# Patient Record
Sex: Female | Born: 1960
Health system: Southern US, Community
[De-identification: ages and names within clinical notes are randomized; demographics above are authoritative.]

## PROBLEM LIST (undated history)

## (undated) DIAGNOSIS — T7840XA Allergy, unspecified, initial encounter: Secondary | ICD-10-CM

## (undated) DIAGNOSIS — Z9109 Other allergy status, other than to drugs and biological substances: Secondary | ICD-10-CM

## (undated) HISTORY — DX: Allergy, unspecified, initial encounter: T78.40XA

## (undated) HISTORY — DX: Other allergy status, other than to drugs and biological substances: Z91.09

---

## 2000-07-19 ENCOUNTER — Emergency Department (HOSPITAL_COMMUNITY): Admission: EM | Admit: 2000-07-19 | Discharge: 2000-07-19 | Payer: Self-pay | Admitting: *Deleted

## 2000-10-08 ENCOUNTER — Other Ambulatory Visit: Admission: RE | Admit: 2000-10-08 | Discharge: 2000-10-08 | Payer: Self-pay | Admitting: Internal Medicine

## 2001-10-15 ENCOUNTER — Other Ambulatory Visit: Admission: RE | Admit: 2001-10-15 | Discharge: 2001-10-15 | Payer: Self-pay | Admitting: Internal Medicine

## 2003-10-27 ENCOUNTER — Ambulatory Visit (HOSPITAL_COMMUNITY): Admission: RE | Admit: 2003-10-27 | Discharge: 2003-10-27 | Payer: Self-pay | Admitting: General Practice

## 2004-11-06 ENCOUNTER — Ambulatory Visit: Payer: Self-pay | Admitting: Internal Medicine

## 2004-11-06 ENCOUNTER — Other Ambulatory Visit: Admission: RE | Admit: 2004-11-06 | Discharge: 2004-11-06 | Payer: Self-pay | Admitting: Internal Medicine

## 2004-11-06 ENCOUNTER — Encounter (INDEPENDENT_AMBULATORY_CARE_PROVIDER_SITE_OTHER): Payer: Self-pay | Admitting: *Deleted

## 2004-11-21 ENCOUNTER — Ambulatory Visit (HOSPITAL_COMMUNITY): Admission: RE | Admit: 2004-11-21 | Discharge: 2004-11-21 | Payer: Self-pay | Admitting: Internal Medicine

## 2005-03-25 ENCOUNTER — Ambulatory Visit: Payer: Self-pay | Admitting: Internal Medicine

## 2005-11-24 ENCOUNTER — Ambulatory Visit (HOSPITAL_COMMUNITY): Admission: RE | Admit: 2005-11-24 | Discharge: 2005-11-24 | Payer: Self-pay | Admitting: Internal Medicine

## 2005-12-17 ENCOUNTER — Ambulatory Visit: Payer: Self-pay | Admitting: Internal Medicine

## 2005-12-17 LAB — CONVERTED CEMR LAB
ALT: 12 units/L (ref 0–40)
AST: 16 units/L (ref 0–37)
Albumin: 3.6 g/dL (ref 3.5–5.2)
Alkaline Phosphatase: 74 units/L (ref 39–117)
BUN: 8 mg/dL (ref 6–23)
Basophils Absolute: 0.1 10*3/uL (ref 0.0–0.1)
Basophils Relative: 1.4 % — ABNORMAL HIGH (ref 0.0–1.0)
Bilirubin Urine: NEGATIVE
CO2: 30 meq/L (ref 19–32)
Calcium: 9.3 mg/dL (ref 8.4–10.5)
Chloride: 103 meq/L (ref 96–112)
Chol/HDL Ratio, serum: 3.1
Cholesterol: 155 mg/dL (ref 0–200)
Creatinine, Ser: 0.8 mg/dL (ref 0.4–1.2)
Eosinophil percent: 0.9 % (ref 0.0–5.0)
GFR calc non Af Amer: 83 mL/min
Glomerular Filtration Rate, Af Am: 101 mL/min/{1.73_m2}
Glucose, Bld: 89 mg/dL (ref 70–99)
HCT: 36.9 % (ref 36.0–46.0)
HDL: 50.4 mg/dL (ref >39.0)
Hemoglobin, Urine: NEGATIVE
Hemoglobin: 12.4 g/dL (ref 12.0–15.0)
Ketones, ur: NEGATIVE mg/dL
LDL Cholesterol: 92 mg/dL (ref 0–99)
Leukocytes, UA: NEGATIVE
Lymphocytes Relative: 26.4 % (ref 12.0–46.0)
MCHC: 33.6 g/dL (ref 30.0–36.0)
MCV: 90.7 fL (ref 78.0–100.0)
Monocytes Absolute: 0.6 10*3/uL (ref 0.2–0.7)
Monocytes Relative: 7.4 % (ref 3.0–11.0)
Neutro Abs: 5.5 10*3/uL (ref 1.4–7.7)
Neutrophils Relative %: 63.9 % (ref 43.0–77.0)
Nitrite: NEGATIVE
Platelets: 269 10*3/uL (ref 150–400)
Potassium: 3.9 meq/L (ref 3.5–5.1)
RBC: 4.07 M/uL (ref 3.87–5.11)
RDW: 12.6 % (ref 11.5–14.6)
Sodium: 138 meq/L (ref 135–145)
Specific Gravity, Urine: 1.015 (ref 1.000–1.03)
TSH: 0.9 microintl units/mL (ref 0.35–5.50)
Total Bilirubin: 1 mg/dL (ref 0.3–1.2)
Total Protein, Urine: NEGATIVE mg/dL
Total Protein: 7.1 g/dL (ref 6.0–8.3)
Triglyceride fasting, serum: 61 mg/dL (ref 0–149)
Urine Glucose: NEGATIVE mg/dL
Urobilinogen, UA: 2 (ref 0.0–1.0)
VLDL: 12 mg/dL (ref 0–40)
WBC: 8.6 10*3/uL (ref 4.5–10.5)
pH: 7.5 (ref 5.0–8.0)

## 2005-12-19 ENCOUNTER — Other Ambulatory Visit: Admission: RE | Admit: 2005-12-19 | Discharge: 2005-12-19 | Payer: Self-pay | Admitting: Internal Medicine

## 2005-12-19 ENCOUNTER — Encounter: Payer: Self-pay | Admitting: Internal Medicine

## 2005-12-19 ENCOUNTER — Ambulatory Visit: Payer: Self-pay | Admitting: Internal Medicine

## 2006-12-01 ENCOUNTER — Ambulatory Visit (HOSPITAL_COMMUNITY): Admission: RE | Admit: 2006-12-01 | Discharge: 2006-12-01 | Payer: Self-pay | Admitting: Internal Medicine

## 2007-12-02 ENCOUNTER — Ambulatory Visit (HOSPITAL_COMMUNITY): Admission: RE | Admit: 2007-12-02 | Discharge: 2007-12-02 | Payer: Self-pay | Admitting: Internal Medicine

## 2008-12-20 ENCOUNTER — Ambulatory Visit (HOSPITAL_COMMUNITY): Admission: RE | Admit: 2008-12-20 | Discharge: 2008-12-20 | Payer: Self-pay | Admitting: Internal Medicine

## 2009-12-26 ENCOUNTER — Ambulatory Visit (HOSPITAL_COMMUNITY)
Admission: RE | Admit: 2009-12-26 | Discharge: 2009-12-26 | Payer: Self-pay | Source: Home / Self Care | Admitting: Internal Medicine

## 2010-01-02 ENCOUNTER — Emergency Department (HOSPITAL_COMMUNITY)
Admission: EM | Admit: 2010-01-02 | Discharge: 2010-01-02 | Payer: Self-pay | Source: Home / Self Care | Admitting: Emergency Medicine

## 2010-01-10 ENCOUNTER — Encounter: Payer: Self-pay | Admitting: Family Medicine

## 2010-01-10 ENCOUNTER — Ambulatory Visit: Payer: Self-pay | Admitting: Family Medicine

## 2010-01-10 DIAGNOSIS — R5381 Other malaise: Secondary | ICD-10-CM | POA: Insufficient documentation

## 2010-01-10 DIAGNOSIS — R5383 Other fatigue: Secondary | ICD-10-CM

## 2010-01-10 DIAGNOSIS — R209 Unspecified disturbances of skin sensation: Secondary | ICD-10-CM | POA: Insufficient documentation

## 2010-01-10 DIAGNOSIS — R269 Unspecified abnormalities of gait and mobility: Secondary | ICD-10-CM | POA: Insufficient documentation

## 2010-01-10 LAB — CONVERTED CEMR LAB
ANA Titer 1: 1:80 {titer} — ABNORMAL HIGH
Anti Nuclear Antibody(ANA): POSITIVE — AB
Folate: 11.9 ng/mL
Rheumatoid fact SerPl-aCnc: <20 intl units/mL (ref 0–20)
TSH: 1.261 microintl units/mL (ref 0.350–4.500)
Vitamin B-12: 671 pg/mL (ref 211–911)

## 2010-01-14 ENCOUNTER — Encounter: Payer: Self-pay | Admitting: Family Medicine

## 2010-01-17 ENCOUNTER — Telehealth: Payer: Self-pay | Admitting: *Deleted

## 2010-01-23 ENCOUNTER — Encounter: Payer: Self-pay | Admitting: Family Medicine

## 2010-01-26 ENCOUNTER — Emergency Department (HOSPITAL_COMMUNITY)
Admission: EM | Admit: 2010-01-26 | Discharge: 2010-01-26 | Payer: Self-pay | Source: Home / Self Care | Admitting: Emergency Medicine

## 2010-01-26 ENCOUNTER — Inpatient Hospital Stay (HOSPITAL_COMMUNITY)
Admission: EM | Admit: 2010-01-26 | Discharge: 2010-01-30 | Payer: Self-pay | Attending: Family Medicine | Admitting: Family Medicine

## 2010-01-27 HISTORY — PX: CERVICAL FUSION: SHX112

## 2010-02-04 ENCOUNTER — Encounter: Payer: Self-pay | Admitting: *Deleted

## 2010-02-04 ENCOUNTER — Ambulatory Visit: Admission: RE | Admit: 2010-02-04 | Discharge: 2010-02-04 | Payer: Self-pay | Source: Home / Self Care

## 2010-02-04 ENCOUNTER — Encounter: Payer: Self-pay | Admitting: Family Medicine

## 2010-02-04 DIAGNOSIS — M4712 Other spondylosis with myelopathy, cervical region: Secondary | ICD-10-CM | POA: Insufficient documentation

## 2010-02-04 LAB — CONVERTED CEMR LAB: Vit D, 25-Hydroxy: 29 ng/mL — ABNORMAL LOW (ref 30–89)

## 2010-02-13 ENCOUNTER — Encounter: Payer: Self-pay | Admitting: Family Medicine

## 2010-02-19 NOTE — Op Note (Signed)
[name], [name]                 ACCOUNT NO.:  [id number]  MEDICAL RECORD NO.:  [id number]           PATIENT TYPE:  LOCATION:                                 FACILITY:  PHYSICIAN:  Henry A. Pool, M.D.    DATE OF BIRTH:  10-Dec-1960  DATE OF PROCEDURE:  01/29/2010 DATE OF DISCHARGE:                              OPERATIVE REPORT   PREOPERATIVE DIAGNOSIS:  C4-5 and C5-6 stenosis with myelopathy.  POSTOPERATIVE DIAGNOSIS:  C4-5 and C5-6 stenosis with myelopathy.  PROCEDURE NOTE:  C4-5 and C5-6 anterior cervical diskectomy and fusion with allograft and plating.  SURGEON:  Kathaleen Maser. Pool, MD  ASSISTANT:  Reinaldo Meeker, MD  ANESTHESIA:  General endotracheal.  PREMEDICATION:  [name] is a 50 year old female with history of progressive bilateral upper extremity weakness and sensory loss.  Workup demonstrates evidence of severe cervical stenosis.  I discussed situation with the patient.  I have recommended that she undergo C4-5 and C5-6 anterior cervical diskectomy and fusion with allograft anterior plating in hopes of improving her symptoms.  The patient is aware of the risks and benefits and wish to proceed.  OPERATION NOTE:  The patient was taken to the operating room, placed on the operating table in supine position.  After adequate level of anesthesia was achieved, the patient was positioned supine with neck slightly extended and placed in halter traction.  The patient's anterior cervical region was prepped and draped sterilely.  A #10 blade was used to make a linear skin incision overlying the C5 vertebral level.  This was carried down sharply to the platysma.  The platysma was then divided vertically and dissection proceeded on the medial border of the sternocleidomastoid muscle and carotid sheath.  Trachea and esophagus were mobilized and tracked towards the left.  The prevertebral fascia was stripped off the anterior spinal column.  Longus colli muscle was elevated  bilaterally using electrocautery.  Deep self-retaining retractor was placed.  Intraoperative fluoroscopy was used, levels were confirmed.  Disk space at both levels were then incised with 15 blade in a rectangular fashion.  Wide disk space clean-out was achieved using pituitary rongeurs, forward and backward Karlin curettes, Kerrison rongeurs, high-speed drill.  Elements of the disk were removed down to the level of the posterior annulus.  Microscope was then brought into the field and used throughout the remainder of diskectomy.  The remaining aspects of annulus and osteophytes were removed using high- speed drill down to the level of the posterior longitudinal ligament. The posterior longitudinal ligament was then elevated and resected in piecemeal fashion using Kerrison rongeurs.  The underlying thecal sac was then identified.  Wide central decompression was then performed by undercutting the bodies of C5 and C6.  Decompression then proceeded out to the edges of the neural foramen.  Anterior foraminotomies were then performed along the course of the exiting C6 nerve roots bilaterally. All elements of the central disk herniation were completely resected. At this point, a very thorough decompression had been achieved.  There was no injury to thecal sac or nerve roots.  Procedure was then repeated at C4-5,  again without complication.  Wound was then irrigated with antibiotic solution.  Gelfoam was placed topically for hemostasis. Cornerstone allograft wedge was then packed into place, recessed approximately 1 mm from the anterior cortical margin of C4-5 and C5-6. A 40-mm Atlantis translational anterior cervical plate was then placed over the C4, 5, and 6 levels.  This was then attached under fluoroscopic guidance using 13-mm fixed angle screws.  All four screws given final tightening and found to be solid within bone.  Locking screws were then engaged at all three levels.  Final images  revealed good position of the bone grafts, hardware at proper operative level with normal alignment of spine.  The wound was then irrigated with antibiotic solution. Hemostasis was ensured with bipolar electrocautery.  The wound was then closed in layers.  Steri-Strips and sterile dressings were applied. There were no complications.  The patient tolerated the procedure well, and she returned to recovery room postoperatively.          ______________________________ Kathaleen Maser Pool, M.D.     HAP/MEDQ  D:  01/29/2010  T:  01/29/2010  Job:  [id number]  Electronically Signed by Julio Sicks M.D. on 02/19/2010 08:15:22 AM

## 2010-02-19 NOTE — Consult Note (Signed)
NAMECHRYSA, RAMPY                 ACCOUNT NO.:  [id number]  MEDICAL RECORD NO.:  [id number]          PATIENT TYPE:  INP  LOCATION:  5526                         FACILITY:  [facility]  PHYSICIAN:  Henry A. Pool, M.D.    DATE OF BIRTH:  08-19-1960  DATE OF CONSULTATION:  01/27/2010 DATE OF DISCHARGE:                                CONSULTATION   CONSULTING PHYSICIAN:  Sherilyn Cooter A. Pool, MD  REFERRING PHYSICIAN:  Nakeeta Sebastiani A. Sheffield Slider, MD  HISTORY OF PRESENT ILLNESS:  Ms. Stach is a 50 year old female who has a 2-1/57-month history of progressive upper and lower extremity numbness, paresthesias, and weakness.  These symptoms began insidiously without any specific precipitating event.  The patient is noted having progressive difficulty with numbness involving both upper and lower extremities.  She is having difficulty with some degree of dysesthetic sensations in her hands and feet.  She has noticed decreased fine motor strength and coordination in her upper extremities and hands.  She is having very difficulty with walking.  She notes that her gait is hard to control.  She has been stumbling and has fallen on a number of occasions.  She admits to frequent urinary incontinence.  She has had no fecal incontinence.  She gives a history consistent with the Lhermitte's- type phenomenon on a few different occasions.  Overall, she feels these symptoms have progressively worsened.  PAST HISTORY:  Notable for no other ongoing major medical problem.  The patient is in good general health.  CURRENT MEDICATIONS:  None.  ALLERGIES:  None.  FAMILY HISTORY:  Noncontributory.  SOCIAL HISTORY:  She is single.  She works as a Lawyer.  She is a nonsmoker and denies alcohol use.  REVIEW OF SYSTEMS:  Completely noted in the chart (the patient is noncontributory for current management).  PHYSICAL EXAMINATION:  She is awake and alert.  She is oriented and appropriate.  Cranial nerve function is intact.  Examination  of the extremities reveals evidence of 5/5 strength in both deltoid, supraspinatus, and infraspinatus muscles.  She has some mild weakness of her biceps and wrist extensors bilaterally.  She has significant weakness grading at a 4/5 in both triceps.  Her grip strength and intrinsic strength in her hands are markedly diminished grading out at 3/5.  Lower extremity function finds her tone to be significantly increased with poor motor control.  Strength is grossly 4+/5.  Sensory examination reveals patchy distal sensory loss in both upper extremities.  She has a relative sensory level around C6.  Deep tendon reflexes are markedly increased in both upper and lower extremities. She has positive Hoffman responses in both hands.  Toes are upgoing to plantar stimulation.  I reviewed the patient's MRI scan of her cervical spine.  This demonstrates evidence of a very large central disk herniation at C5-6 with severe spinal cord compression.  She has a moderate central disk bulge at C4-5, also causing significant spinal stenosis.  The remainder of her cervical spine is unremarkable.  Examination of her MRI scans of her brain, thoracic and lumbar spine are negative.  This patient has evidence of a  very significant progressive cervical myelopathy secondary to her cervical stenosis at C4-5 and most definitely at the C5-6 level.  I discussed the situation with the patient.  It recommended that she undergo a C4-5 and C5-6 anterior cervical diskectomy and fusion with allograft and plating in the hopes of improving her symptoms.  I discussed the risks and benefits involving the surgery including but not limited to the risks of anesthesia, bleeding,  infection, CSF leak, nerve root injury, spinal cord injury, effusion, failure to resuscitate, dysphasia, dysphonia, kidney failure and not getting better.  The patient agreed and I answered questions. She understands.  We will plan for surgery sometime on  Tuesday.          ______________________________ Kathaleen Maser. Pool, M.D.     HAP/MEDQ  D:  01/27/2010  T:  01/28/2010  Job:  [id number]  Electronically Signed by Julio Sicks M.D. on 02/19/2010 08:15:19 AM

## 2010-02-21 ENCOUNTER — Ambulatory Visit: Admit: 2010-02-21 | Payer: Self-pay | Admitting: Family Medicine

## 2010-02-22 NOTE — H&P (Signed)
[name], [name]                 ACCOUNT NO.:  [id number]  MEDICAL RECORD NO.:  [id number]          PATIENT TYPE:  INP  LOCATION:  5526                         FACILITY:  [facility]  PHYSICIAN:  Carsynn Bethune A. Sheffield Slider, M.D.    DATE OF BIRTH:  Mar 02, 1960  DATE OF ADMISSION:  01/26/2010 DATE OF DISCHARGE:                             HISTORY & PHYSICAL   CHIEF COMPLAINT:  Progressive weakness.  HISTORY OF PRESENT ILLNESS:  The patient is a 50 year old female who presents with weakness since October that started in her right lower extremity associated with falling and gait instability that progressed to involve bilateral lower extremities and her trunk.  The patient reports initially presenting to [facility] in early October where she was diagnosed with vitamin D deficiency and started on vitamin D supplements.  She has taken the vitamin D supplements but felt that they were making her weak, that is why she discontinued them.  She then presented to [facility] once she received her orange card and was seen by Dr. Shawnie Pons on January 10, 2010 where she was also evaluated for weakness.  At that time, her symptoms were concerning for neurological etiology of her weakness and plan was to have her follow up with outpatient neurology.  She also had labs drawn including ANA, rheumatoid factor, TSH, B12, and folate, all were within normal limits except for fall risk which is slightly positive at 1:80.  The patient states that since she was seen in clinic her weakness has become progressively worse associated with falling.  She has fallen 2 times in the past week, last fall was today where she hit her head while on the job.  No loss of consciousness.  She also describes numbness in her feet as well as a heavy feeling in her bilateral feet but this is in her anterior thighs in the morning, numbness in her lateral hand with involuntary flexion with fingers of both hands.  One  week ago, she became incontinent of urine and her incontinence continues today.  She has no fecal incontinence.  She denies blurred vision, changes in vision, dysarthria, or dysphasia.  She also denies muscle pain.  ALLERGIES:  The patient has no known drug allergies.  PAST MEDICAL HISTORY:  The patient has no significant past medical history.  HOSPITALIZATIONS:  No previous ED visit.  PAST SURGICAL HISTORY:  Only tonsillectomy.  MEDICATIONS:  The patient is taking none currently.  Recently, she was taken vitamin D 400 units daily and Flexeril 10 mg p.o. t.i.d. as needed.  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  She is single.  Her mother and 2 uncles live in town.  She denies tobacco or illicit drug use.  She admits to rare alcohol use.  FAMILY HISTORY:  The patient's mother is diabetic, alcoholism with dipsomania.  Her father died at an young age.  No other significant past family history.  REVIEW OF SYSTEMS:  As per HPI.  ENDOCRINE:  The patient denies weight changes.  The patient denies fevers or chills.  HEENT:  Denies blurred  vision and dysarthria.  CARDIOVASCULAR:  The patient denies chest pain or palpitations.  RESPIRATORY:  The patient denies shortness of breath. GI:  The patient denies nausea, vomiting, diarrhea, or fecal incontinence.  GU:  The patient denies dysuria.  MUSCULOSKELETAL:  The patient denies muscle pain.  SKIN:  The patient denies rashes.  PHYSICAL EXAMINATION:  VITAL SIGNS:  Temperature 98.2, pulse 87, blood pressure 116/72, respiratory rate 20, O2 sat 96% on room air, and weight 81.1 kg. GENERAL:  The patient is awake and in no acute distress. HEAD AND NECK:  She has a contusion over her right forehead.  Otherwise, her head is normocephalic and atraumatic.  Pupils are equal, round, reactive to light, and accommodating.  She has moist mucous membranes. NECK:  Supple and nontender. NEURO:  Cranial nerves II through XII are  intact.  Sensory:  Intact bilateral fine touch intact to proprioception.  Motor:  The patient has 5/5 strength in upper and lower extremities bilaterally.  Reflexes:  3+ in bilateral upper extremities and lower extremities.  Clonus on the bilateral lower extremities.  Cerebellum:  Left hand-to-finger and to nose, she said it is like overshooting dysmetria.  Right hand finger-to- nose within normal limits.  Bilateral heel-to-shin is intact.  Gait: The patient's gait is slow, wide based, shuffling, and tentative.  The patient is oriented x3.  She is able to repeat, delayed recall is 1/3. Reasoning is intact.  Serial 3s are intact.  Some difficulty with subtraction. CARDIOVASCULAR:  S1-S2.  Regular rate and rhythm.  No murmurs, rubs, or gallops. RESPIRATORY:  Clear to auscultation bilaterally.  Normal work of breathing. ABDOMEN:  Soft, nontender, and nondistended. MUSCULOSKELETAL:  Muscles are nontender.  There is no atrophy appreciated.  She has full range of motion in all muscle groups. SKIN:  No rashes. EXTREMITIES:  She has 2 pulses in her bilateral radial and dorsalis pedis.  LABORATORY DATA/STUDIES:  CT of the head and C-spine, January 26, 2010: No intracranial trauma.  No acute intracranial findings.  No evidence of C-spine fracture.  BMET:  Sodium 138, potassium 4.1, chloride 104, bicarb 26, BUN 9, creatinine 0.6, glucose 111, alk phos 78, T-bili 1, total protein 8, albumin 3.8, AST 27, ALT 16, and calcium 9.5.  CBC: White blood cells 12.7, hemoglobin 13, hematocrit 38.7, and platelets 257.  UA shows large blood, otherwise within normal limits.  Urine micro:  Many red blood cells.  The patient is currently on her menstrual cycle.  ASSESSMENT AND PLAN:  This is a 50 year old female with progressive weakness. 1. Weakness:  Consider urgent neuro etiologies including cauda equina     syndrome and normal pressure hydrocephalous, both are highly     unlikely given the patient's  progressive nature of symptoms and     normal CT findings.  Also consider myasthenia gravis, less likely     given the patient has no vision changes and on physical exam she     has remote 5/5 strength.  Also consider multiple sclerosis,     however, this is also less likely given the patient's persistent     nature of findings and also no vision changes and no previous     history of neurological deficits.  Other differentials include     autoimmune and inflammatory disease like lupus, polymyositis     rheumatica, and hypothyroidism.  The patient had normal ANA, TSH,     and only slightly positive rheumatoid factor at 1:80 in clinic.  Also had a normal B12 and folate.  The plan is to admit to [facility] to repeat the TSH, folate, B12, ANA,     rheumatoid factor since centricity is currently down.  Also, we     will obtain a urine drug strain and UA, urine drug screen and blood     alcohol level.  Plan to consult Neurology in the morning.  Obtain     MRI with and without contrast of the head. 2. Fluids, electrolytes, nutrition, and gastrointestinal:  The patient     has normal electrolytes.  We will start regular     diet and repeat her BMET in the morning. 3. Prophylaxis:  Heparin 5000 units subcu t.i.d. 4. Disposition:  The patient will remain in house pending neuro     workup.    ______________________________ Dessa Phi, MD   ______________________________ Arnette Norris. Sheffield Slider, M.D.    JF/MEDQ  D:  01/27/2010  T:  01/28/2010  Job:  [id number]  Electronically Signed by Dessa Phi MD on 01/29/2010 10:02:35 PM Electronically Signed by Zachery Dauer M.D. on 02/22/2010 06:15:39 PM

## 2010-02-28 ENCOUNTER — Other Ambulatory Visit: Payer: Self-pay | Admitting: Neurosurgery

## 2010-02-28 ENCOUNTER — Ambulatory Visit
Admission: RE | Admit: 2010-02-28 | Discharge: 2010-02-28 | Disposition: A | Discharge status: Home / Self Care | Payer: Self-pay | Source: Ambulatory Visit | Attending: Neurosurgery | Admitting: Neurosurgery

## 2010-02-28 DIAGNOSIS — M4802 Spinal stenosis, cervical region: Secondary | ICD-10-CM

## 2010-02-28 NOTE — Miscellaneous (Signed)
Summary: ROI  ROI   Imported By: Bradly Bienenstock 01/23/2010 11:52:13  _____________________________________________________________________  External Attachment:    Type:   Image     Comment:   External Document

## 2010-02-28 NOTE — Assessment & Plan Note (Signed)
Summary: Hosp FU/kf   Vital Signs:  Patient profile:   50 year old female Height:      65 inches Weight:      189.1 pounds BMI:     31.58 Temp:     99.4 degrees F oral Pulse rate:   112 / minute BP sitting:   128 / 78  (left arm) Cuff size:   regular  Vitals Entered By: Garen Grams LPN (February 04, 2010 2:26 PM) CC: hfu Is Patient Diabetic? No Pain Assessment Patient in pain? no        CC:  hfu.  History of Present Illness: Pt. has had an extensive medical work-up since last seen.  At that time she had some weakness, gait abnormality and falls.  She, then developed urinary incontinence and was admitted, worked up by Neurology which led to MRI imaging of cspine led to diagnosis of spondylosis with myelopathy.  She underwent C4-5 and C5-6 fusion, diskectomy and clean out with rapid reversal of symptoms.  PT has started coming to her house and she is using a walker.  She has regained some strength in her legs and sensation in her feet.  She continues to have a sense of tightness in her legs.  She is taking Vicoding and flexeril as needed.  Habits & Providers  Alcohol-Tobacco-Diet     Alcohol drinks/day: 0     Tobacco Status: never  Current Problems (verified): 1)  Family History Diabetes 1st Degree Relative  (ICD-V18.0) 2)  Abnormality of Gait  (ICD-781.2) 3)  Fatigue  (ICD-780.79) 4)  Paresthesia  (ICD-782.0)  Current Medications (verified): 1)  Vitamin D 400 Unit Caps (Cholecalciferol) .Marland Kitchen.. 1 Daily 2)  Flexeril 10 Mg Tabs (Cyclobenzaprine Hcl) .Marland Kitchen.. 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  The patient denies anorexia, chest pain, dyspnea on exertion, prolonged cough, headaches, and incontinence.         C/o constipation  Physical Exam  General:  alert, well-developed, and well-nourished.   Head:  normocephalic and atraumatic.   Neck:  supple.  Incision is well-healed. Lungs:  normal respiratory effort.   Heart:  normal rate.   Abdomen:  soft and non-tender.   Neurologic:  ataxic gait but much improved.  Using walker.   Impression & Recommendations:  Problem # 1:  CERVICAL SPONDYLOSIS WITH MYELOPATHY (ICD-721.1)  Orders: [facility]- Est Level  2 ([address])  Problem # 2:  FATIGUE (ICD-780.79)  Orders: Vit D, 25 [facility] ([address]11)  Complete Medication List: 1)  Vitamin D 400 Unit Caps (Cholecalciferol) .Marland Kitchen.. 1 daily 2)  Flexeril 10 Mg Tabs (Cyclobenzaprine hcl) .Marland Kitchen.. 1 by mouth three times a day as needed  Patient Instructions: 1)  Please schedule a follow-up appointment in 1 month.  Prescriptions: FLEXERIL 10 MG TABS (CYCLOBENZAPRINE HCL) 1 by mouth three times a day as needed  #45 x 2   Entered and Authorized by:   Tinnie Gens MD   Signed by:   Tinnie Gens MD on 02/04/2010   Method used:  Print then Give to Patient   RxID:   [id number] FLEXERIL 10 MG TABS (CYCLOBENZAPRINE HCL) 1 by mouth three times a day as needed  #45 x 2   Entered and Authorized by:   Tinnie Gens MD   Signed by:   Tinnie Gens MD on 02/04/2010   Method used:   Faxed to ...       [facility] (retail)       [address]       [place], [place]  [address]       Ph: [phone]       Fax: [phone]   RxID:   [id number]  Pt. decided against using MAP and will take hand written rx to [facility] pharmacy.  Orders Added: 1)  [facility]- Est Level  2 [99212] 2)  Vit D, 25 [facility] [phone]

## 2010-02-28 NOTE — Letter (Signed)
Summary: Results Follow-up Letter  All     ,     Phone:   Fax:     01/14/2010  [address] [place], [place]  [address]  Dear Ms. [name],   The following are the results of your recent test(s):  Thyroid, B12, Folate and Rheumatoid Arthritis testing are normal.  Your Lupus test is equivocal or undetermined.   We will repeat this testing later.  We need your records from your previous doctor's office.  We really would like for Neurology to see you.   Sincerely,      Tinnie Gens MD

## 2010-02-28 NOTE — Miscellaneous (Signed)
Summary: Discharge Summary  Clinical Lists Changes  [name], [name]                 ACCOUNT NO.:  [id number]      MEDICAL RECORD NO.:  [id number]          PATIENT TYPE:  INP      LOCATION:  5526                         FACILITY:  [facility]      PHYSICIAN:  Leighton Roach McDiarmid, M.D.DATE OF BIRTH:  [birth year]      DATE OF ADMISSION:  01/26/2010   DATE OF DISCHARGE:  01/30/2010                                  DISCHARGE SUMMARY         ATTENDING PHYSICIAN:  Leighton Roach McDiarmid, MD      PRIMARY CARE PROVIDER:  [facility]      REASON FOR HOSPITALIZATION:  Progressive weakness.      DISCHARGE DIAGNOSIS:  C4-C5 and C5-C6 central cord compression.      DISCHARGE MEDICATIONS:      NEW MEDICATIONS:   1. Cyclobenzaprine 10 mg p.o. q.8 h p.r.n.   2. Vicodin 5/325 one to two tablets p.o. q.4 h. p.r.n.   3. MiraLax 17 g p.o. daily to b.i.d. for constipation.   4. Senokot S tablet 1 tablet p.o. p.r.n. constipation.   5. Zofran 4 mg p.o. q.4-6 h. p.r.n. nausea.      CONSULTS:  Neurosurgery.      PROCEDURES:  Anterior C4-C5 and C5-C6 anterior diskectomy and fusion.      LABORATORY DATA:  TSH 2.399; vitamin B12 normal at 631; folic acid   normal at 14.6; RA, rheumatoid factor normal, less than 20; ANA   negative.  MRI of the C-spine showed congenitally narrowed spinal canal   with superimposed degenerative changes with varying degrees of spinal   stenosis, cord compression, and foraminal narrowing.  Findings most   notable at the C5-6 level where there is a large central disk protrusion   with cord compression.  Within the compressed cord, there is   gliosis/edema.  C4-C5 showed mild spinal stenosis and mild cord   flattening.  Mild bilateral foraminal narrowing greater on the left.      BRIEF HOSPITAL COURSE:  This is a 50 year old presenting with   progressive weakness particularly of her lower extremities.   1. Cervical and central cord compression, status post  C4-C5 and C5-C6       anterior cervical diskectomy and fusion.  On admission, MRI showed       cervical central cord compression.  Neurosurgery was consulted who       started the patient on Decadron.  The patient went to the OR on       hospital day 4.  The patient did well in the OR.  On hospital day       #5 or postoperative day #2, the patient's neurological status was       significantly improved compared to that on admission.  The patient       was able to ambulate slowly with a walker.  The patient had full       strength in her upper and lower extremities.  The patient still had  some decreased sensation in her feet, however, this was improving       as well.  Otherwise, sensation was intact.  The patient was not       endorsing any pain at the time of discharge.  Her pain was well       controlled with the Vicodin and Flexeril p.r.n.  The patient had       also had regained urinary incontinence, which she had lost over the       past few months.  The patient was evaluated by PT and OT.  PT and       OT will visit the patient at home through a home health.  At the       time of discharge, the patient was afebrile with stable vitals and       was tolerating her diet without any nausea.  The patient had been       previously living alone at home.  The patient was discharged to       home in the afternoon.  The patient's uncle will be staying with       the patient in this postoperative period.      DISCHARGE INSTRUCTIONS:  Per Neurosurgery recommendations, the patient   will keep her neck brace on for 1 week and then wrap afterwards.  The   patient has a followup appointment with neurosurgeon, Dr. Jordan Likes in a   week.  The patient will make this appointment.  The patient has a follow   up at the office at [facility] with her PCP in   the next 1-2 weeks.  The patient was discharged home in stable medical   condition.             ______________________________   Priscella Mann, MD         ______________________________   Leighton Roach McDiarmid, M.D.            AO/MEDQ  D:  01/30/2010  T:  01/30/2010  Job:  [id number]      cc:   Sherilyn Cooter A. Pool, M.D.

## 2010-02-28 NOTE — Assessment & Plan Note (Signed)
Summary: np,df   Vital Signs:  Patient profile:   50 year old female Height:      65 inches Weight:      191 pounds BMI:     31.90 BSA:     1.94 Temp:     98.8 degrees F Pulse rate:   81 / minute BP sitting:   118 / 65  Vitals Entered By: Jone Baseman CMA (January 10, 2010 10:18 AM) CC: NP Is Patient Diabetic? No Pain Assessment Patient in pain? no     Location: right legs   CC:  NP.  History of Present Illness: Pt. is new pt.  Previously seen at [facility].  Has several month h/o abnl gait and inability to walk. She reports paresthesias on 4th and 5th phalanges bilaterally and weakness in her right leg with numbness.  She reports stiffness in left lower extremity.  Reports she needs a cane.  Per pt. she has been diagnosed with Vitamin D deficiency and is taking replacement meds, however, she is perhaps getting weaker still on meds so she has stopped them in last several days.  She first noticed the weakness after going out drinking and she is worried that maybe someone poisoned her.  She reports previously being in her usual state of health until last several months.  She is without a job currently but would like to return to work.  She was seen in the ED and noted to have paresthesias and is here for Neuro referral and follow-up.  Had negative CT, BMP, CBC, UA at ED.  She has fallen several times.  Habits & Providers  Alcohol-Tobacco-Diet     Alcohol drinks/day: 0     Tobacco Status: never  Exercise-Depression-Behavior     Does Patient Exercise: yes     Type of exercise: walking     Times/week: <3     Drug Use: never     Seat Belt Use: always     Sun Exposure: rarely  Current Medications (verified): 1)  Vitamin D 400 Unit Caps (Cholecalciferol) .Marland Kitchen.. 1 Daily 2)  Flexeril 10 Mg Tabs (Cyclobenzaprine Hcl) .Marland Kitchen.. 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    Physical Exam  General:  alert, appropriate dress, and cooperative to examination.   Head:  normocephalic and atraumatic.   Nose:  no external deformity.   Neck:  supple.   Lungs:  normal respiratory effort and normal breath sounds.   Abdomen:  soft, non-tender, no distention, no masses, and no guarding.   Extremities:  No clubbing, cyanosis, edema, or deformity noted with normal full range of motion of all joints.   Neurologic:  alert & oriented X3, cranial nerves II-XII intact, strength normal in all extremities, sensation intact to light touch, DTRs symmetrical and normal, finger-to-nose normal, heel-to-shin normal, toes down  bilaterally on Babinski, and ataxic gait.  No nystagmus. Skin:  turgor normal.     Impression & Recommendations:  Problem # 1:  ABNORMALITY OF GAIT (ICD-781.2) Unclear etiology and despite nml neuro exam her ability to walk is certainly compromised. Orders: Rheum Fact-[facility] [phone]) Neurology Referral (Neuro)  Problem # 2:  PARESTHESIA (ICD-782.0)  Orders: ANA-[facility] ([id number]) B12-[facility] ([id number]) Folate-[facility] ([id number]) Neurology Referral (Neuro)  Problem # 3:  FATIGUE (ICD-780.79)  Orders: TSH-[facility] ([id number])  Complete Medication List: 1)  Vitamin D 400 Unit  Caps (Cholecalciferol) .Marland Kitchen.. 1 daily 2)  Flexeril 10 Mg Tabs (Cyclobenzaprine hcl) .Marland Kitchen.. 1 by mouth three times a day as needed  Patient Instructions: 1)  Please schedule a follow-up appointment in 1 month.    Orders Added: 1)  ANA-[facility] [[id number]] 2)  B12-[facility] [[id number]] 3)  Folate-[facility] [[id number]] 4)  TSH-[facility] [[id number]] 5)  Rheum Fact-[facility] [25956] 6)  Neurology Referral [Neuro] 7)  [facility]- New Level 4 [[id number]]  Appended Document: np,df    Clinical Lists Changes  Observations: Added new observation of PAP DUE: 01/2010 (12/26/2009 17:10) Added new observation of MAMMOGRAM: birads-1 (12/26/2009 17:10)

## 2010-02-28 NOTE — Letter (Signed)
Summary: Results Follow-up Letter  All     ,     Phone:   Fax:     02/13/2010  [address] [place], [place]  [address]  Dear Ms. Allison Quarry,   The following are the results of your recent test(s):  Vitamin D level = 29    Sincerely,     Tinnie Gens MD            Appended Document: Results Follow-up Letter letter mailed

## 2010-02-28 NOTE — Progress Notes (Signed)
Summary: phn msg  Phone Note Call from Patient Call back at Home Phone [phone]   Caller: Patient Summary of Call: wants to know when she can get an appt w/ Neurologist asap - she has [facility] and isn't sure how quickly she can get in, but she is scared because she keeps falling and is worried something is really wrong.  She will be bringing [employer] card in as soon as it comes in the mail. Initial call taken by: De Nurse,  January 17, 2010 9:05 AM  Follow-up for Phone Call        Will route to her PCP. Follow-up by: Dennison Nancy RN,  January 19, 2010 8:55 AM

## 2010-03-04 ENCOUNTER — Encounter: Payer: Self-pay | Admitting: Family Medicine

## 2010-03-04 ENCOUNTER — Ambulatory Visit (INDEPENDENT_AMBULATORY_CARE_PROVIDER_SITE_OTHER): Payer: Self-pay | Admitting: Family Medicine

## 2010-03-04 ENCOUNTER — Other Ambulatory Visit: Payer: Self-pay | Admitting: Family Medicine

## 2010-03-04 DIAGNOSIS — Z124 Encounter for screening for malignant neoplasm of cervix: Secondary | ICD-10-CM

## 2010-03-04 DIAGNOSIS — M4712 Other spondylosis with myelopathy, cervical region: Secondary | ICD-10-CM

## 2010-03-05 ENCOUNTER — Encounter: Payer: Self-pay | Admitting: *Deleted

## 2010-03-10 ENCOUNTER — Encounter: Payer: Self-pay | Admitting: Family Medicine

## 2010-03-14 NOTE — Assessment & Plan Note (Signed)
Summary: dental referral,df   Vital Signs:  Patient profile:   50 year old female Height:      65 inches Weight:      186.31 pounds BMI:     31.12 BSA:     1.92 Temp:     98.7 degrees F Pulse rate:   92 / minute BP sitting:   110 / 80  Vitals Entered By: Jone Baseman CMA (March 04, 2010 3:08 PM) CC: dental referral Is Patient Diabetic? No Pain Assessment Patient in pain? no        CC:  dental referral.  History of Present Illness: Returns today with continued improvement in symptoms since surgery.  She is in need of a dental referral.  Last pap was 1/11.  Habits & Providers  Alcohol-Tobacco-Diet     Alcohol drinks/day: 0     Tobacco Status: never  Current Problems (verified): 1)  Screening For Malignant Neoplasm of The Cervix  (ICD-V76.2) 2)  Cervical Spondylosis With Myelopathy  (ICD-721.1) 3)  Abnormality of Gait  (ICD-781.2) 4)  Fatigue  (ICD-780.79) 5)  Paresthesia  (ICD-782.0) 6)  Family History Diabetes 1st Degree Relative  (ICD-V18.0)  Current Medications (verified): 1)  Vitamin D 400 Unit Caps (Cholecalciferol) .Marland Kitchen.. 1 Daily 2)  Flexeril 10 Mg Tabs (Cyclobenzaprine Hcl) .Marland Kitchen.. 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    Physical Exam  General:  alert, well-developed, and well-nourished.   Head:  normocephalic and atraumatic.   Neck:  supple.   Breasts:  No mass, nodules, thickening, tenderness, bulging, retraction, inflamation,  nipple discharge or skin changes noted.   Lungs:  normal respiratory effort and normal breath sounds.   Heart:  normal rate and regular rhythm.   Abdomen:  soft and non-tender.   Genitalia:  Normal introitus for age, no external lesions, no vaginal discharge, mucosa pink and moist, no vaginal or cervical lesions, no vaginal atrophy, no friaility or hemorrhage, normal uterus size and position, no adnexal masses or tenderness Axillary Nodes:  No palpable lymphadenopathy   Impression & Recommendations:  Problem # 1:  SCREENING FOR MALIGNANT NEOPLASM OF THE CERVIX (ICD-V76.2) Assessment Unchanged  Orders: Pap Smear-[facility] ([id number])  Problem # 2:  CERVICAL SPONDYLOSIS WITH MYELOPATHY (ICD-721.1)  Complete Medication List: 1)  Vitamin D 400 Unit Caps (Cholecalciferol) .Marland Kitchen.. 1 daily 2)  Flexeril 10 Mg Tabs (Cyclobenzaprine hcl) .Marland Kitchen.. 1 by mouth three times a day as needed  Other Orders: [facility]- Est Level  3 ([address])  Patient Instructions: 1)  Please schedule a follow-up appointment in 3 months .    Orders Added: 1)  Pap Smear-[facility] [[id number]] 2)  [facility]- Est Level  3 [28413]

## 2010-03-20 NOTE — Letter (Signed)
Summary: Results Follow-up Letter  All     ,     Phone:   Fax:     03/10/2010  [address] [place], [place]  [address]  [place]  Dear Ms. Allison Quarry,   The following are the results of your recent test(s):  Test     Result     Pap Smear    Normal___X____  Not Normal_____     Keep your regularly scheduled follow-up appointment.  Sincerely,       Tinnie Gens MD

## 2010-04-01 ENCOUNTER — Telehealth: Payer: Self-pay | Admitting: Family Medicine

## 2010-04-01 NOTE — Telephone Encounter (Signed)
Patient dropped off form to be filled out for disability.  Please call her when completed.  Placed form in dr's box.

## 2010-04-04 NOTE — Telephone Encounter (Signed)
Md handed back form and ask that I call and complete part of it by calling and asking the patient.  Last questions answered and patient informed that she can come pick up the form whenever she can Mudlogger, Maryjo Rochester

## 2010-04-08 LAB — CROSSMATCH
ABO/RH(D): AB POS
Antibody Screen: NEGATIVE
Unit division: 0
Unit division: 0
Unit division: 0
Unit division: 0

## 2010-04-08 LAB — BASIC METABOLIC PANEL
CO2: 25 mEq/L (ref 19–32)
Calcium: 8.7 mg/dL (ref 8.4–10.5)
Calcium: 9.3 mg/dL (ref 8.4–10.5)
Chloride: 108 mEq/L (ref 96–112)
Chloride: 109 mEq/L (ref 96–112)
Creatinine, Ser: 0.65 mg/dL (ref 0.4–1.2)
GFR calc Af Amer: >60 mL/min (ref >60)
GFR calc Af Amer: >60 mL/min (ref >60)
GFR calc Af Amer: >60 mL/min (ref >60)
Potassium: 4.2 mEq/L (ref 3.5–5.1)
Sodium: 139 mEq/L (ref 135–145)

## 2010-04-08 LAB — COMPREHENSIVE METABOLIC PANEL
ALT: 16 U/L (ref 0–35)
AST: 27 U/L (ref 0–37)
Albumin: 3.8 g/dL (ref 3.5–5.2)
Alkaline Phosphatase: 78 U/L (ref 39–117)
BUN: 9 mg/dL (ref 6–23)
CO2: 26 mEq/L (ref 19–32)
Calcium: 9.5 mg/dL (ref 8.4–10.5)
Chloride: 104 mEq/L (ref 96–112)
Creatinine, Ser: 0.86 mg/dL (ref 0.4–1.2)
GFR calc Af Amer: >60 mL/min (ref >60)
GFR calc non Af Amer: >60 mL/min (ref >60)
Glucose, Bld: 111 mg/dL — ABNORMAL HIGH (ref 70–99)
Potassium: 4.1 mEq/L (ref 3.5–5.1)
Sodium: 138 mEq/L (ref 135–145)
Total Bilirubin: 1 mg/dL (ref 0.3–1.2)
Total Protein: 8 g/dL (ref 6.0–8.3)

## 2010-04-08 LAB — DIFFERENTIAL
Basophils Relative: 0 % (ref 0–1)
Eosinophils Absolute: 0.1 10*3/uL (ref 0.0–0.7)
Lymphocytes Relative: 24 % (ref 12–46)
Lymphocytes Relative: 32 % (ref 12–46)
Lymphs Abs: 2.2 10*3/uL (ref 0.7–4.0)
Monocytes Relative: 6 % (ref 3–12)
Monocytes Relative: 8 % (ref 3–12)
Neutro Abs: 8.7 10*3/uL — ABNORMAL HIGH (ref 1.7–7.7)
Neutro Abs: 4.1 10*3/uL (ref 1.7–7.7)
Neutrophils Relative %: 69 % (ref 43–77)
Neutrophils Relative %: 59 % (ref 43–77)

## 2010-04-08 LAB — CBC
HCT: 38.7 % (ref 36.0–46.0)
HCT: 35.4 % — ABNORMAL LOW (ref 36.0–46.0)
Hemoglobin: 13 g/dL (ref 12.0–15.0)
Hemoglobin: 12.7 g/dL (ref 12.0–15.0)
MCH: 30.8 pg (ref 26.0–34.0)
MCH: 30.9 pg (ref 26.0–34.0)
MCHC: 33.6 g/dL (ref 30.0–36.0)
MCV: 91.7 fL (ref 78.0–100.0)
MCV: 90.2 fL (ref 78.0–100.0)
MCV: 91.2 fL (ref 78.0–100.0)
Platelets: 257 10*3/uL (ref 150–400)
Platelets: 238 10*3/uL (ref 150–400)
Platelets: 265 10*3/uL (ref 150–400)
RBC: 4.22 MIL/uL (ref 3.87–5.11)
RBC: 3.78 MIL/uL — ABNORMAL LOW (ref 3.87–5.11)
RBC: 3.88 MIL/uL (ref 3.87–5.11)
RBC: 4.11 MIL/uL (ref 3.87–5.11)
RDW: 12.6 % (ref 11.5–15.5)
WBC: 12.7 10*3/uL — ABNORMAL HIGH (ref 4.0–10.5)
WBC: 14.2 10*3/uL — ABNORMAL HIGH (ref 4.0–10.5)
WBC: 9.2 10*3/uL (ref 4.0–10.5)
WBC: 6.9 10*3/uL (ref 4.0–10.5)

## 2010-04-08 LAB — TSH: TSH: 2.399 u[IU]/mL (ref 0.350–4.500)

## 2010-04-08 LAB — VITAMIN B12: Vitamin B-12: 631 pg/mL (ref 211–911)

## 2010-04-08 LAB — FOLATE: Folate: 14.6 ng/mL

## 2010-04-08 LAB — URINALYSIS, ROUTINE W REFLEX MICROSCOPIC
Bilirubin Urine: NEGATIVE
Bilirubin Urine: NEGATIVE
Glucose, UA: NEGATIVE mg/dL
Hgb urine dipstick: NEGATIVE
Ketones, ur: NEGATIVE mg/dL
Leukocytes, UA: NEGATIVE
Nitrite: NEGATIVE
Nitrite: NEGATIVE
Protein, ur: NEGATIVE mg/dL
Specific Gravity, Urine: 1.012 (ref 1.005–1.030)
Specific Gravity, Urine: 1.009 (ref 1.005–1.030)
Urobilinogen, UA: 1 mg/dL (ref 0.0–1.0)
pH: 6.5 (ref 5.0–8.0)
pH: 7.5 (ref 5.0–8.0)

## 2010-04-08 LAB — URINE MICROSCOPIC-ADD ON

## 2010-04-08 LAB — ABO/RH: ABO/RH(D): AB POS

## 2010-04-08 LAB — ETHANOL: Alcohol, Ethyl (B): <5 mg/dL (ref 0–10)

## 2010-05-13 ENCOUNTER — Ambulatory Visit (INDEPENDENT_AMBULATORY_CARE_PROVIDER_SITE_OTHER): Payer: Self-pay | Admitting: *Deleted

## 2010-05-13 DIAGNOSIS — Z111 Encounter for screening for respiratory tuberculosis: Secondary | ICD-10-CM

## 2010-05-15 ENCOUNTER — Ambulatory Visit (INDEPENDENT_AMBULATORY_CARE_PROVIDER_SITE_OTHER): Payer: Self-pay | Admitting: *Deleted

## 2010-05-15 DIAGNOSIS — IMO0001 Reserved for inherently not codable concepts without codable children: Secondary | ICD-10-CM

## 2010-05-15 DIAGNOSIS — Z111 Encounter for screening for respiratory tuberculosis: Secondary | ICD-10-CM

## 2010-05-15 LAB — TB SKIN TEST
Induration: 0
TB Skin Test: NEGATIVE mm

## 2010-05-15 NOTE — Progress Notes (Signed)
PPD negative-0 mm. 

## 2010-06-14 NOTE — Consult Note (Signed)
[facility]  Patient:    [name], [name]                        MRN: [id number] Proc. Date: 07/19/00 Adm. Date:  [id number] Disc. Date: [id number] Attending:  Ephriam Knuckles H                          Consultation Report  PHYSICIAN REQUESTING CONSULTATION:  ______ .  REASON FOR CONSULTATION:  [name] is a very pleasant 50 year old female, right-hand dominant, who presents today with an acutely swollen and tender left thumb IP joint with no specific injury noted.  She is an otherwise healthy 50 year old female with no known drug allergies, no current medications, no recent hospitalizations or surgery.  FAMILY MEDICAL HISTORY:  Noncontributory.  SOCIAL HISTORY:  Noncontributory.  PHYSICAL EXAMINATION:  GENERAL:  On examination in the emergency department today, a well-developed, well-nourished female, pleasant, alert and oriented x 3.  EXTREMITIES:  On examination of her left thumb, she has swelling and tenderness over the distal interphalangeal joint.  She has full FPL and EPL function though her range of motion is limited.  There is no instability of the CMC, MP or IP joint.  There is no evidence of an eponychial infection. There is no tenderness of the eponychial fold and there is no drainage from underneath the eponychial fold.  Her sharp/dull sensation is intact and again, swelling over the IP joint of the thumb on the left.  The rest of her left hand exam is within normal limits.  No signs of distal neurovascular compromise.  X-RAY FINDINGS:  X-rays in the emergency department show possible gouty arthropathy, particularly on the lateral view.  No other evidence of infection or fracture, dislocation, etc.  IMPRESSION:  We have a 50 year old female with probable acute gouty arthropathy, left thumb interphalangeal joint.  RECOMMENDATIONS:  At this point in time, patient was discharged from the emergency department with Naprosyn 500 mg one p.o.  b.i.d. with food, Keflex 500 mg one p.o. q.i.d. for antibiotic prophylaxis in case this is an infection, which is low likelihood; we will also do a uric acid level which we will check in the next 24 hours and she is to follow up in my office on July 21, 2000.  If this is an acute gouty arthropathy, we will get her on the appropriate followup for gout with her medical doctor and will again make sure that it is not infected by covering with the antibiotics.  She is off work until further notice due to the pain and swelling and again, we will see her back on July 21, 2000. DD:  07/19/00 TD:  07/20/00 Job: [id number]

## 2010-06-14 NOTE — Assessment & Plan Note (Signed)
[facility]                           PRIMARY CARE OFFICE NOTE   NAME:[name], [name]                        MRN:          [id number]  DATE:12/19/2005                            DOB:          August 27, 1960    Patient is a 50 year old female who presents for a wellness examination.   Past medical history, family history and social history as per November 06, 2004 note.  She remains single.  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   CURRENT MEDICATIONS:  None.   ALLERGIES:  NONE.   REVIEW OF SYSTEMS:  No chest pain or shortness of breath.  Complains of  excessive vaginal discharge.  Complains of occasional pimples from  ingrown hair on the pubic area.  The rest of patient's system review is  negative.   PHYSICAL EXAMINATION:  Blood pressure 112/69.  Pulse 82.  Temp 99.  Weight 193 pounds (was 197).  She looks well.  She is in no acute  distress.  HEENT:  With moist mucosa.  NECK:  Supple.  No thyromegaly or bruits.  LUNGS:  Clear.  No wheezes or rales.  HEART:  S1, S2.  No murmurs.  No gallops.  ABDOMEN:  Soft and nontender.  No organomegaly or masses felt.  LOWER EXTREMITIES:  Without edema.  She is alert and oriented.  Denies being depressed.  BREASTS:  Symmetric.  Nipples without discharge.  No masses.  Vaginal area is clear.  There is a slight ruptured fistula on the right  labia measuring about 3 mm without discharge.  Visual mucosa is normal  with somewhat excessive whitish discharge.  Cervix visualized.  Pap  smear obtained.  KOH smear obtained.  Bimanual examination is normal.  No masses.   Labs on December 17, 2005:  CBC normal.  CMET normal.  Cholesterol 165.  TSH normal.   ASSESSMENT AND PLAN:  1. Normal wellness examination.  Age/health related issues discussed.      Healthy lifestyle discussed. Pap smear obtained.  She has had a      mammogram.  Human immunodeficiency virus status per her request.     Safe sex discussed.  Repeat exam in 12 months.  2. Vaginitis.  Nonspecific.  Obtained KOH smear.  Treat depending on      the results.  3. Ingrown hair.  Treat with doxycycline 100 p.o. t.i.d. for 10 days.      She will try to straighten out the bent over hair.  Use      antibacterial soap.     Georgina Quint. Plotnikov, MD  Electronically Signed    AVP/MedQ  DD: 12/23/2005  DT: 12/23/2005  Job #: [id number]

## 2010-09-09 ENCOUNTER — Ambulatory Visit: Payer: Self-pay

## 2010-09-13 ENCOUNTER — Ambulatory Visit (INDEPENDENT_AMBULATORY_CARE_PROVIDER_SITE_OTHER): Payer: Self-pay | Admitting: *Deleted

## 2010-09-13 DIAGNOSIS — Z23 Encounter for immunization: Secondary | ICD-10-CM

## 2010-09-13 MED ORDER — HEPATITIS B VAC RECOMBINANT 10 MCG/0.5ML IJ SUSP: 0.5000 mL | Freq: Once | INTRAMUSCULAR | Status: DC

## 2010-09-13 NOTE — Progress Notes (Signed)
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  Is requesting the Hep B vaccine.

## 2010-10-22 ENCOUNTER — Ambulatory Visit (INDEPENDENT_AMBULATORY_CARE_PROVIDER_SITE_OTHER): Payer: Self-pay | Admitting: *Deleted

## 2010-10-22 VITALS — Temp 98.4°F

## 2010-10-22 DIAGNOSIS — Z23 Encounter for immunization: Secondary | ICD-10-CM

## 2010-11-22 ENCOUNTER — Other Ambulatory Visit: Payer: Self-pay | Admitting: Family Medicine

## 2010-11-22 DIAGNOSIS — Z1231 Encounter for screening mammogram for malignant neoplasm of breast: Secondary | ICD-10-CM

## 2010-11-25 ENCOUNTER — Ambulatory Visit: Payer: Self-pay | Admitting: Family Medicine

## 2010-12-30 ENCOUNTER — Ambulatory Visit (HOSPITAL_COMMUNITY)
Admission: RE | Admit: 2010-12-30 | Discharge: 2010-12-30 | Disposition: A | Discharge status: Home / Self Care | Payer: Self-pay | Source: Ambulatory Visit | Attending: Family Medicine | Admitting: Family Medicine

## 2010-12-30 ENCOUNTER — Ambulatory Visit (INDEPENDENT_AMBULATORY_CARE_PROVIDER_SITE_OTHER): Payer: Self-pay | Admitting: Family Medicine

## 2010-12-30 ENCOUNTER — Encounter: Payer: Self-pay | Admitting: Family Medicine

## 2010-12-30 VITALS — BP 119/74 | HR 111 | Temp 98.2°F | Ht 65.0 in | Wt 187.6 lb

## 2010-12-30 DIAGNOSIS — Z1231 Encounter for screening mammogram for malignant neoplasm of breast: Secondary | ICD-10-CM | POA: Insufficient documentation

## 2010-12-30 DIAGNOSIS — Z7251 High risk heterosexual behavior: Secondary | ICD-10-CM

## 2010-12-30 DIAGNOSIS — Z23 Encounter for immunization: Secondary | ICD-10-CM

## 2010-12-30 NOTE — Patient Instructions (Addendum)
Low Back Strain  with Rehab  A strain is an injury in which a tendon or muscle is torn. The muscles and tendons of the lower back are vulnerable to strains. However, these muscles and tendons are very strong and require a great force to be injured. Strains are classified into three categories. Grade 1 strains cause pain, but the tendon is not lengthened. Grade 2 strains include a lengthened ligament, due to the ligament being stretched or partially ruptured. With grade 2 strains there is still function, although the function may be decreased. Grade 3 strains involve a complete tear of the tendon or muscle, and function is usually impaired.  SYMPTOMS    Pain in the lower back.   Pain that affects one side more than the other.   Pain that gets worse with movement and may be felt in the hip, buttocks, or back of the thigh.   Muscle spasms of the muscles in the back.   Swelling along the muscles of the back.   Loss of strength of the back muscles.   Crackling sound (crepitation) when the muscles are touched.  CAUSES   Lower back strains occur when a force is placed on the muscles or tendons that is greater than they can handle. Common causes of injury include:   Prolonged overuse of the muscle-tendon units in the lower back, usually from incorrect posture.   A single violent injury or force applied to the back.  RISK INCREASES WITH:   Sports that involve twisting forces on the spine or a lot of bending at the waist (football, rugby, weightlifting, bowling, golf, tennis, speed skating, racquetball, swimming, running, gymnastics, diving).   Poor strength and flexibility.   Failure to warm up properly before activity.   Family history of lower back pain or disk disorders.   Previous back injury or surgery (especially fusion).   Poor posture with lifting, especially heavy objects.   Prolonged sitting, especially with poor posture.  PREVENTION    Learn and use proper posture when sitting or lifting (maintain  proper posture when sitting, lift using the knees and legs, not at the waist).   Warm up and stretch properly before activity.   Allow for adequate recovery between workouts.   Maintain physical fitness:   Strength, flexibility, and endurance.   Cardiovascular fitness.  PROGNOSIS   If treated properly, lower back strains usually heal within 6 weeks.  RELATED COMPLICATIONS    Recurring symptoms, resulting in a chronic problem.   Chronic inflammation, scarring, and partial muscle-tendon tear.   Delayed healing or resolution of symptoms.   Prolonged disability.  TREATMENT   Treatment first involves the use of ice and medicine, to reduce pain and inflammation. The use of strengthening and stretching exercises may help reduce pain with activity. These exercises may be performed at home or with a therapist. Severe injuries may require referral to a therapist for further evaluation and treatment, such as ultrasound. Your caregiver may advise that you wear a back brace or corset, to help reduce pain and discomfort. Often, prolonged bed rest results in greater harm then benefit. Corticosteroid injections may be recommended. However, these should be reserved for the most serious cases. It is important to avoid using your back when lifting objects. At night, sleep on your back on a firm mattress with a pillow placed under your knees. If non-surgical treatment is unsuccessful, surgery may be needed.   MEDICATION    If pain medicine is needed, nonsteroidal anti-inflammatory medicines (  Do not take pain medicine for 7 days before surgery.   Prescription pain relievers may be given, if your caregiver thinks they are needed. Use only as directed and only as much as you need.   Ointments applied to the skin may be helpful.   Corticosteroid injections may be given by your caregiver. These injections  should be reserved for the most serious cases, because they may only be given a certain number of times.  HEAT AND COLD  Cold treatment (icing) should be applied for 10 to 15 minutes every 2 to 3 hours for inflammation and pain, and immediately after activity that aggravates your symptoms. Use ice packs or an ice massage.   Heat treatment may be used before performing stretching and strengthening activities prescribed by your caregiver, physical therapist, or athletic trainer. Use a heat pack or a warm water soak.  SEEK MEDICAL CARE IF:   Symptoms get worse or do not improve in 2 to 4 weeks, despite treatment.   You develop numbness, weakness, or loss of bowel or bladder function.   New, unexplained symptoms develop. (Drugs used in treatment may produce side effects.)  EXERCISES  RANGE OF MOTION (ROM) AND STRETCHING EXERCISES - Low Back Strain Most people with lower back pain will find that their symptoms get worse with excessive bending forward (flexion) or arching at the lower back (extension). The exercises which will help resolve your symptoms will focus on the opposite motion.  Your physician, physical therapist or athletic trainer will help you determine which exercises will be most helpful to resolve your lower back pain. Do not complete any exercises without first consulting with your caregiver. Discontinue any exercises which make your symptoms worse until you speak to your caregiver.  If you have pain, numbness or tingling which travels down into your buttocks, leg or foot, the goal of the therapy is for these symptoms to move closer to your back and eventually resolve. Sometimes, these leg symptoms will get better, but your lower back pain may worsen. This is typically an indication of progress in your rehabilitation. Be very alert to any changes in your symptoms and the activities in which you participated in the 24 hours prior to the change. Sharing this information with your caregiver  will allow him/her to most efficiently treat your condition.  These exercises may help you when beginning to rehabilitate your injury. Your symptoms may resolve with or without further involvement from your physician, physical therapist or athletic trainer. While completing these exercises, remember:   Restoring tissue flexibility helps normal motion to return to the joints. This allows healthier, less painful movement and activity.   An effective stretch should be held for at least 30 seconds.   A stretch should never be painful. You should only feel a gentle lengthening or release in the stretched tissue.  FLEXION RANGE OF MOTION AND STRETCHING EXERCISES: STRETCH - Flexion, Single Knee to Chest   Lie on a firm bed or floor with both legs extended in front of you.   Keeping one leg in contact with the floor, bring your opposite knee to your chest. Hold your leg in place by either grabbing behind your thigh or at your knee.   Pull until you feel a gentle stretch in your lower back. Hold __________ seconds.   Slowly release your grasp and repeat the exercise with the opposite side.  Repeat __________ times. Complete this exercise __________ times per day.  STRETCH - Flexion, Double Knee to Chest  Lie on a firm bed or floor with both legs extended in front of you.   Keeping one leg in contact with the floor, bring your opposite knee to your chest.   Tense your stomach muscles to support your back and then lift your other knee to your chest. Hold your legs in place by either grabbing behind your thighs or at your knees.   Pull both knees toward your chest until you feel a gentle stretch in your lower back. Hold __________ seconds.   Tense your stomach muscles and slowly return one leg at a time to the floor.  Repeat __________ times. Complete this exercise __________ times per day.  STRETCH - Low Trunk Rotation  Lie on a firm bed or floor. Keeping your legs in front of you, bend your  knees so they are both pointed toward the ceiling and your feet are flat on the floor.   Extend your arms out to the side. This will stabilize your upper body by keeping your shoulders in contact with the floor.   Gently and slowly drop both knees together to one side until you feel a gentle stretch in your lower back. Hold for __________ seconds.   Tense your stomach muscles to support your lower back as you bring your knees back to the starting position. Repeat the exercise to the other side.  Repeat __________ times. Complete this exercise __________ times per day  EXTENSION RANGE OF MOTION AND FLEXIBILITY EXERCISES: STRETCH - Extension, Prone on Elbows   Lie on your stomach on the floor, a bed will be too soft. Place your palms about shoulder width apart and at the height of your head.   Place your elbows under your shoulders. If this is too painful, stack pillows under your chest.   Allow your body to relax so that your hips drop lower and make contact more completely with the floor.   Hold this position for __________ seconds.   Slowly return to lying flat on the floor.  Repeat __________ times. Complete this exercise __________ times per day.  RANGE OF MOTION - Extension, Prone Press Ups  Lie on your stomach on the floor, a bed will be too soft. Place your palms about shoulder width apart and at the height of your head.   Keeping your back as relaxed as possible, slowly straighten your elbows while keeping your hips on the floor. You may adjust the placement of your hands to maximize your comfort. As you gain motion, your hands will come more underneath your shoulders.   Hold this position __________ seconds.   Slowly return to lying flat on the floor.  Repeat __________ times. Complete this exercise __________ times per day.  RANGE OF MOTION- Quadruped, Neutral Spine   Assume a hands and knees position on a firm surface. Keep your hands under your shoulders and your knees  under your hips. You may place padding under your knees for comfort.   Drop your head and point your tail bone toward the ground below you. This will round out your lower back like an angry cat. Hold this position for __________ seconds.   Slowly lift your head and release your tail bone so that your back sags into a large arch, like an old horse.   Hold this position for __________ seconds.   Repeat this until you feel limber in your lower back.   Now, find your "sweet spot." This will be the most comfortable position somewhere between the two  previous positions. This is your neutral spine. Once you have found this position, tense your stomach muscles to support your lower back.   Hold this position for __________ seconds.  Repeat __________ times. Complete this exercise __________ times per day.  STRENGTHENING EXERCISES - Low Back Strain These exercises may help you when beginning to rehabilitate your injury. These exercises should be done near your "sweet spot." This is the neutral, low-back arch, somewhere between fully rounded and fully arched, that is your least painful position. When performed in this safe range of motion, these exercises can be used for people who have either a flexion or extension based injury. These exercises may resolve your symptoms with or without further involvement from your physician, physical therapist or athletic trainer. While completing these exercises, remember:   Muscles can gain both the endurance and the strength needed for everyday activities through controlled exercises.   Complete these exercises as instructed by your physician, physical therapist or athletic trainer. Increase the resistance and repetitions only as guided.   You may experience muscle soreness or fatigue, but the pain or discomfort you are trying to eliminate should never worsen during these exercises. If this pain does worsen, stop and make certain you are following the directions  exactly. If the pain is still present after adjustments, discontinue the exercise until you can discuss the trouble with your caregiver.  STRENGTHENING - Deep Abdominals, Pelvic Tilt  Lie on a firm bed or floor. Keeping your legs in front of you, bend your knees so they are both pointed toward the ceiling and your feet are flat on the floor.   Tense your lower abdominal muscles to press your lower back into the floor. This motion will rotate your pelvis so that your tail bone is scooping upwards rather than pointing at your feet or into the floor.   With a gentle tension and even breathing, hold this position for __________ seconds.  Repeat __________ times. Complete this exercise __________ times per day.  STRENGTHENING - Abdominals, Crunches   Lie on a firm bed or floor. Keeping your legs in front of you, bend your knees so they are both pointed toward the ceiling and your feet are flat on the floor. Cross your arms over your chest.   Slightly tip your chin down without bending your neck.   Tense your abdominals and slowly lift your trunk high enough to just clear your shoulder blades. Lifting higher can put excessive stress on the lower back and does not further strengthen your abdominal muscles.   Control your return to the starting position.  Repeat __________ times. Complete this exercise __________ times per day.  STRENGTHENING - Quadruped, Opposite UE/LE Lift   Assume a hands and knees position on a firm surface. Keep your hands under your shoulders and your knees under your hips. You may place padding under your knees for comfort.   Find your neutral spine and gently tense your abdominal muscles so that you can maintain this position. Your shoulders and hips should form a rectangle that is parallel with the floor and is not twisted.   Keeping your trunk steady, lift your right hand no higher than your shoulder and then your left leg no higher than your hip. Make sure you are not  holding your breath. Hold this position __________ seconds.   Continuing to keep your abdominal muscles tense and your back steady, slowly return to your starting position. Repeat with the opposite arm and leg.  Repeat  __________ times. Complete this exercise __________ times per day.  STRENGTHENING - Lower Abdominals, Double Knee Lift  Lie on a firm bed or floor. Keeping your legs in front of you, bend your knees so they are both pointed toward the ceiling and your feet are flat on the floor.   Tense your abdominal muscles to brace your lower back and slowly lift both of your knees until they come over your hips. Be certain not to hold your breath.   Hold __________ seconds. Using your abdominal muscles, return to the starting position in a slow and controlled manner.  Repeat __________ times. Complete this exercise __________ times per day.  POSTURE AND BODY MECHANICS CONSIDERATIONS - Low Back Strain Keeping correct posture when sitting, standing or completing your activities will reduce the stress put on different body tissues, allowing injured tissues a chance to heal and limiting painful experiences. The following are general guidelines for improved posture. Your physician or physical therapist will provide you with any instructions specific to your needs. While reading these guidelines, remember:  The exercises prescribed by your provider will help you have the flexibility and strength to maintain correct postures.   The correct posture provides the best environment for your joints to work. All of your joints have less wear and tear when properly supported by a spine with good posture. This means you will experience a healthier, less painful body.   Correct posture must be practiced with all of your activities, especially prolonged sitting and standing. Correct posture is as important when doing repetitive low-stress activities (typing) as it is when doing a single heavy-load activity  (lifting).  RESTING POSITIONS Consider which positions are most painful for you when choosing a resting position. If you have pain with flexion-based activities (sitting, bending, stooping, squatting), choose a position that allows you to rest in a less flexed posture. You would want to avoid curling into a fetal position on your side. If your pain worsens with extension-based activities (prolonged standing, working overhead), avoid resting in an extended position such as sleeping on your stomach. Most people will find more comfort when they rest with their spine in a more neutral position, neither too rounded nor too arched. Lying on a non-sagging bed on your side with a pillow between your knees, or on your back with a pillow under your knees will often provide some relief. Keep in mind, being in any one position for a prolonged period of time, no matter how correct your posture, can still lead to stiffness. PROPER SITTING POSTURE In order to minimize stress and discomfort on your spine, you must sit with correct posture. Sitting with good posture should be effortless for a healthy body. Returning to good posture is a gradual process. Many people can work toward this most comfortably by using various supports until they have the flexibility and strength to maintain this posture on their own. When sitting with proper posture, your ears will fall over your shoulders and your shoulders will fall over your hips. You should use the back of the chair to support your upper back. Your lower back will be in a neutral position, just slightly arched. You may place a small pillow or folded towel at the base of your lower back for support.  When working at a desk, create an environment that supports good, upright posture. Without extra support, muscles tire, which leads to excessive strain on joints and other tissues. Keep these recommendations in mind: CHAIR:  A chair should  be able to slide under your desk when your  back makes contact with the back of the chair. This allows you to work closely.   The chair's height should allow your eyes to be level with the upper part of your monitor and your hands to be slightly lower than your elbows.  BODY POSITION  Your feet should make contact with the floor. If this is not possible, use a foot rest.   Keep your ears over your shoulders. This will reduce stress on your neck and lower back.  INCORRECT SITTING POSTURES  If you are feeling tired and unable to assume a healthy sitting posture, do not slouch or slump. This puts excessive strain on your back tissues, causing more damage and pain. Healthier options include:  Using more support, like a lumbar pillow.   Switching tasks to something that requires you to be upright or walking.   Talking a brief walk.   Lying down to rest in a neutral-spine position.  PROLONGED STANDING WHILE SLIGHTLY LEANING FORWARD  When completing a task that requires you to lean forward while standing in one place for a long time, place either foot up on a stationary 2-4 inch high object to help maintain the best posture. When both feet are on the ground, the lower back tends to lose its slight inward curve. If this curve flattens (or becomes too large), then the back and your other joints will experience too much stress, tire more quickly, and can cause pain. CORRECT STANDING POSTURES Proper standing posture should be assumed with all daily activities, even if they only take a few moments, like when brushing your teeth. As in sitting, your ears should fall over your shoulders and your shoulders should fall over your hips. You should keep a slight tension in your abdominal muscles to brace your spine. Your tailbone should point down to the ground, not behind your body, resulting in an over-extended swayback posture.  INCORRECT STANDING POSTURES  Common incorrect standing postures include a forward head, locked knees and/or an excessive  swayback. WALKING Walk with an upright posture. Your ears, shoulders and hips should all line-up. PROLONGED ACTIVITY IN A FLEXED POSITION When completing a task that requires you to bend forward at your waist or lean over a low surface, try to find a way to stabilize 3 out of 4 of your limbs. You can place a hand or elbow on your thigh or rest a knee on the surface you are reaching across. This will provide you more stability so that your muscles do not fatigue as quickly. By keeping your knees relaxed, or slightly bent, you will also reduce stress across your lower back. CORRECT LIFTING TECHNIQUES DO :   Assume a wide stance. This will provide you more stability and the opportunity to get as close as possible to the object which you are lifting.   Tense your abdominals to brace your spine. Bend at the knees and hips. Keeping your back locked in a neutral-spine position, lift using your leg muscles. Lift with your legs, keeping your back straight.   Test the weight of unknown objects before attempting to lift them.   Try to keep your elbows locked down at your sides in order get the best strength from your shoulders when carrying an object.   Always ask for help when lifting heavy or awkward objects.  INCORRECT LIFTING TECHNIQUES DO NOT:   Lock your knees when lifting, even if it is a small object.  Bend and twist. Pivot at your feet or move your feet when needing to change directions.   Assume that you can safely pick up even a paper clip without proper posture.  Document Released: 01/13/2005 Document Revised: 09/25/2010 Document Reviewed: 04/27/2008 [facility] Patient Information 2012 [place], [place].Preventative Care for Adults, Female A healthy lifestyle and preventative care can promote health and wellness. Preventative health guidelines for women include the following key practices:  A routine yearly physical is a good way to check with your caregiver about your health and  preventative screening. It is a chance to share any concerns and updates on your health, and to receive a thorough exam.   Visit your dentist for a routine exam and preventative care every 6 months. Brush your teeth twice a day and floss once a day. Good oral hygiene prevents tooth decay and gum disease.   The frequency of eye exams is based on your age, health, family medical history, use of contact lenses, and other factors. Follow your caregiver's recommendations for frequency of eye exams.   Eat a healthy diet. Foods like vegetables, fruits, whole grains, low-fat dairy products, and lean protein foods contain the nutrients you need without too many calories. Decrease your intake of foods high in solid fats, added sugars, and salt. Eat the right amount of calories for you.Get information about a proper diet from your caregiver, if necessary.   Regular physical exercise is one of the most important things you can do for your health. Most adults should get at least 150 minutes of moderate-intensity exercise (any activity that increases your heart rate and causes you to sweat) each week. In addition, most adults need muscle-strengthening exercises on 2 or more days a week.   Maintain a healthy weight. The body mass index (BMI) is a screening tool to identify possible weight problems. It provides an estimate of body fat based on height and weight. Your caregiver can help determine your BMI, and can help you achieve or maintain a healthy weight.For adults 20 years and older:   A BMI below 18.5 is considered underweight.   A BMI of 18.5 to 24.9 is normal.   A BMI of 25 to 29.9 is considered overweight.   A BMI of 30 and above is considered obese.   Maintain normal blood lipids and cholesterol levels by exercising and minimizing your intake of saturated fat. Eat a balanced diet with plenty of fruit and vegetables. Blood tests for lipids and cholesterol should begin at age 70 and be repeated every 5  years. If your lipid or cholesterol levels are high, you are over 50, or you are a high risk for heart disease, you may need your cholesterol levels checked more frequently.Ongoing high lipid and cholesterol levels should be treated with medicines if diet and exercise are not effective.   If you smoke, find out from your caregiver how to quit. If you do not use tobacco, do not start.   If you are pregnant, do not drink alcohol. If you are breastfeeding, be very cautious about drinking alcohol. If you are not pregnant and choose to drink alcohol, do not exceed 1 drink per day. One drink is considered to be 12 ounces (355 mL) of beer, 5 ounces (148 mL) of wine, or 1.5 ounces (44 mL) of liquor.   Avoid use of street drugs. Do not share needles with anyone. Ask for help if you need support or instructions about stopping the use of drugs.   High blood  pressure causes heart disease and increases the risk of stroke. Your blood pressure should be checked at least every 1 to 2 years. Ongoing high blood pressure should be treated with medicines if weight loss and exercise are not effective.   If you are 60 to 48 years old, ask your caregiver if you should take aspirin to prevent strokes.   Diabetes screening involves taking a blood sample to check your fasting blood sugar level. This should be done once every 3 years, after age 57, if you are within normal weight and without risk factors for diabetes. Testing should be considered at a younger age or be carried out more frequently if you are overweight and have at least 1 risk factor for diabetes.   Breast cancer screening is essential preventative care for women. You should practice "breast self-awareness." This means understanding the normal appearance and feel of your breasts and may include breast self-examination. Any changes detected, no matter how small, should be reported to a caregiver. Women in their 9s and 30s should have a clinical breast exam (CBE)  by a caregiver as part of a regular health exam every 1 to 3 years. After age 14, women should have a CBE every year. Starting at age 22, women should consider having a mammogram (breast X-ray) every year. Women who have a family history of breast cancer should talk to their caregiver about genetic screening. Women at a high risk of breast cancer should talk to their caregiver about having an MRI and a mammogram every year.   The Pap test is a screening test for cervical cancer. A Pap test can show cell changes on the cervix that might become cervical cancer if left untreated. A Pap test is a procedure in which cells are obtained and examined from the lower end of the uterus (cervix).   Women should have a Pap test starting at age 68.   Between ages 66 and 88, Pap tests should be repeated every 2 years.   Beginning at age 83, you should have a Pap test every 3 years as long as the past 3 Pap tests have been normal.   Some women have medical problems that increase the chance of getting cervical cancer. Talk to your caregiver about these problems. It is especially important to talk to your caregiver if a new problem develops soon after your last Pap test. In these cases, your caregiver may recommend more frequent screening and Pap tests.   The above recommendations are the same for women who have or have not gotten the vaccine for human papillomavirus (HPV).   If you had a hysterectomy for a problem that was not cancer or a condition that could lead to cancer, then you no longer need Pap tests. Even if you no longer need a Pap test, a regular exam is a good idea to make sure no other problems are starting.   If you are between ages 51 and 54, and you have had normal Pap tests going back 10 years, you no longer need Pap tests. Even if you no longer need a Pap test, a regular exam is a good idea to make sure no other problems are starting.   If you have had past treatment for cervical cancer or a  condition that could lead to cancer, you need Pap tests and screening for cancer for at least 20 years after your treatment.   If Pap tests have been discontinued, risk factors (such as a new sexual  partner) need to be reassessed to determine if screening should be resumed.   The HPV test is an additional test that may be used for cervical cancer screening. The HPV test looks for the virus that can cause the cell changes on the cervix. The cells collected during the Pap test can be tested for HPV. The HPV test could be used to screen women aged 14 years and older, and should be used in women of any age who have unclear Pap test results. After the age of 67, women should have HPV testing at the same frequency as a Pap test.   Colorectal cancer can be detected and often prevented. Most routine colorectal cancer screening begins at the age of 35 and continues through age 46. However, your caregiver may recommend screening at an earlier age if you have risk factors for colon cancer. On a yearly basis, your caregiver may provide home test kits to check for hidden blood in the stool. Use of a small camera at the end of a tube, to directly examine the colon (sigmoidoscopy or colonoscopy), can detect the earliest forms of colorectal cancer. Talk to your caregiver about this at age 38, when routine screening begins. Direct examination of the colon should be repeated every 5 to 10 years through age 86, unless early forms of pre-cancerous polyps or small growths are found.   Practice safe sex. Use condoms and avoid high-risk sexual practices to reduce the spread of sexually transmitted infections (STIs). STIs include gonorrhea, chlamydia, syphilis, trichomonas, herpes, HPV, and human immunodeficiency virus (HIV). Herpes, HIV, and HPV are viral illnesses that have no cure. They can result in disability, cancer, and death. Sexually active women aged 51 and younger should be checked for Chlamydia. Older women with new or  multiple partners should also be tested for Chlamydia. Testing for other STIs is recommended if you are sexually active and at increased risk.   Osteoporosis is a disease in which the bones lose minerals and strength with aging. This can result in serious bone fractures. The risk of osteoporosis can be identified using a bone density scan. Women ages 76 and over and women at risk for fractures or osteoporosis should discuss screening with their caregivers. Ask your caregiver whether you should take a calcium supplement or vitamin D to reduce the rate of osteoporosis.   Menopause can be associated with physical symptoms and risks. Hormone replacement therapy is available to decrease symptoms and risks. You should talk to your caregiver about whether hormone replacement therapy is right for you.   Use sunscreen with skin protection factor (SPF) of 30 or more. Apply sunscreen liberally and repeatedly throughout the day. You should seek shade when your shadow is shorter than you. Protect yourself by wearing long sleeves, pants, a wide-brimmed hat, and sunglasses year round, whenever you are outdoors.   Once a month, do a whole body skin exam, using a mirror to look at the skin on your back. Notify your caregiver of new moles, moles that have irregular borders, moles that are larger than a pencil eraser, or moles that have changed in shape or color.   Stay current with required immunizations.   Influenza. You need a dose every fall (or winter). The composition of the flu vaccine changes each year, so being vaccinated once is not enough.   Pneumococcal polysaccharide. You need 1 to 2 doses if you smoke cigarettes or if you have certain chronic medical conditions. You need 1 dose at age 68 (  or older) if you have never been vaccinated.   Tetanus, diphtheria, pertussis (Tdap, Td). Get 1 dose of Tdap vaccine if you are younger than age 80 years, are over 73 and have contact with an infant, are a Advertising copywriter, are pregnant, or simply want to be protected from whooping cough. After that, you need a Td booster dose every 10 years. Consult your caregiver if you have not had at least 3 tetanus and diphtheria-containing shots sometime in your life or have a deep or dirty wound.   HPV. You need this vaccine if you are a woman age 26 years or younger. The vaccine is given in 3 doses over 6 months.   Measles, mumps, rubella (MMR). You need at least 1 dose of MMR if you were born in 1957 or later. You may also need a 2nd dose.   Meningococcal. If you are age 51 to 63 years and a Orthoptist living in a residence hall, or have one of several medical conditions, you need to get vaccinated against meningococcal disease. You may also need additional booster doses.   Zoster (shingles). If you are age 83 years or older, you should get this vaccine.   Varicella (chickenpox). If you have never had chickenpox or you were vaccinated but received only 1 dose, talk to your caregiver to find out if you need this vaccine.   Hepatitis A. You need this vaccine if you have a specific risk factor for hepatitis A virus infection or you simply wish to be protected from this disease. The vaccine is usually given as 2 doses, 6 to 18 months apart.   Hepatitis B. You need this vaccine if you have a specific risk factor for hepatitis B virus infection or you simply wish to be protected from this disease. The vaccine is given in 3 doses, usually over 6 months.  Preventative Services / Frequency Ages 69 to 56  Blood pressure check.** / Every 1 to 2 years.   Lipid and cholesterol check.**/ Every 5 years beginning at age 60.   Clinical breast exam.** / Every 3 years for women in their 58s and 30s.   Pap Test.** / Every 2 years from ages 6 through 65. Every 3 years starting at age 36 years through age 78 or 75 with a history of 3 consecutive normal Pap tests.   HPV Screening.** / Every 3 years from ages 50  through ages 71 to 42 with a history of 3 consecutive normal Pap tests.   Skin self-exam. / Monthly.   Influenza immunization.** / Every year.   Pneumococcal polysaccharide immunization.** / 1 to 2 doses if you smoke cigarettes or if you have certain chronic medical conditions.   Tetanus, diphtheria, pertussis (Tdap,Td) immunization. / A one-time dose of Tdap vaccine. After that, you need a Td booster dose every 10 years.   HPV immunization. / 3 doses over 6 months, if 26 and younger.   Measles, mumps, rubella (MMR) immunization. / You need at least 1 dose of MMR if you were born in 1957 or later. You may also need a 2nd dose.   Meningococcal immunization. / 1 dose if you are age 44 to 57 years and a Orthoptist living in a residence hall, or have one of several medical conditions, you need to get vaccinated against meningococcal disease. You may also need additional booster doses.   Varicella immunization. **/ Consult your caregiver.   Hepatitis A immunization. ** / Consult your  caregiver. 2 doses, 6 to 18 months apart.   Hepatitis B immunization.** / Consult your caregiver. 3 doses usually over 6 months.  Ages 12 to 65  Blood pressure check.** / Every 1 to 2 years.   Lipid and cholesterol check.**/ Every 5 years beginning at age 41.   Clinical breast exam.** / Every year after age 31.   Mammogram.** / Every year beginning at age 22 and continuing for as long as you are in good health. Consult with your caregiver.   Pap Test.** / Every 3 years starting at age 42 years through age 65 or 35 with a history of 3 consecutive normal Pap tests.   HPV Screening.** / Every 3 years from ages 62 through ages 82 to 45 with a history of 3 consecutive normal Pap tests.   Fecal occult blood test (FOBT) of stool. / Every year beginning at age 31 and continuing until age 79. You may not have to do this test if you get colonoscopy every 10 years.   Flexible sigmoidoscopy** or  colonoscopy.** / Every 5 years for a flexible sigmoidoscopy or every 10 years for a colonoscopy beginning at age 39 and continuing until age 26.   Skin self-exam. / Monthly.   Influenza immunization.** / Every year.   Pneumococcal polysaccharide immunization.** / 1 to 2 doses if you smoke cigarettes or if you have certain chronic medical conditions.   Tetanus, diphtheria, pertussis (Tdap/Td) immunization.** / A one-time dose of Tdap vaccine. After that, you need a Td booster dose every 10 years.   Measles, mumps, rubella (MMR) immunization. / You need at least 1 dose of MMR if you were born in 1957 or later. You may also need a 2nd dose.   Varicella immunization. **/ Consult your caregiver.   Meningococcal immunization.** / Consult your caregiver.     Hepatitis A immunization. ** / Consult your caregiver. 2 doses, 6 to 18 months apart.   Hepatitis B immunization.** / Consult your caregiver. 3 doses, usually over 6 months.  Ages 36 and over  Blood pressure check.** / Every 1 to 2 years.   Lipid and cholesterol check.**/ Every 5 years beginning at age 73.   Clinical breast exam.** / Every year after age 61.   Mammogram.** / Every year beginning at age 12 and continuing for as long as you are in good health. Consult with your caregiver.   Pap Test,** / Every 3 years starting at age 75 years through age 93 or 63 with a 3 consecutive normal Pap tests. Testing can be stopped between 65 and 70 with 3 consecutive normal Pap tests and no abnormal Pap or HPV tests in the past 10 years.   HPV Screening.** / Every 3 years from ages 74 through ages 27 or 90 with a history of 3 consecutive normal Pap tests. Testing can be stopped between 65 and 70 with 3 consecutive normal Pap tests and no abnormal Pap or HPV tests in the past 10 years.   Fecal occult blood test (FOBT) of stool. / Every year beginning at age 31 and continuing until age 38. You may not have to do this test if you get colonoscopy  every 10 years.   Flexible sigmoidoscopy** or colonoscopy.** / Every 5 years for a flexible sigmoidoscopy or every 10 years for a colonoscopy beginning at age 54 and continuing until age 73.   Osteoporosis screening.** / A one-time screening for women ages 3 and over and women at risk for fractures  or osteoporosis.   Skin self-exam. / Monthly.   Influenza immunization.** / Every year.   Pneumococcal polysaccharide immunization.** / 1 dose at age 33 (or older) if you have never been vaccinated.   Tetanus, diphtheria, pertussis (Tdap, Td) immunization. / A one-time dose of Tdap vaccine if you are over 65 and have contact with an infant, are a Research scientist (physical sciences), or simply want to be protected from whooping cough. After that, you need a Td booster dose every 10 years.   Varicella immunization. **/ Consult your caregiver.   Meningococcal immunization.** / Consult your caregiver.   Hepatitis A immunization. ** / Consult your caregiver. 2 doses, 6 to 18 months apart.   Hepatitis B immunization.** / Check with your caregiver. 3 doses, usually over 6 months.  ** Family history and personal history of risk and conditions may change your caregiver's recommendations. Document Released: 03/11/2001 Document Revised: 09/25/2010 Document Reviewed: 06/10/2010 [facility] Patient Information 2012 [place], [place].

## 2010-12-30 NOTE — Progress Notes (Signed)
Addended by: Garen Grams F on: 12/30/2010 04:00 PM   Modules accepted: Orders

## 2010-12-30 NOTE — Progress Notes (Signed)
  Subjective:    Patient ID: [name], female    DOB: 1960/08/05, 50 y.o.   MRN: [id number]  HPI Here today for f/u.  Has mammogram scheduled at 3pm today.  Would like to know if she needs colpo or bone density.  She needs a flu shot and TDaP today.  Would like STD screen.  Is not currently active.  Gaylyn Rong regained almost all of the strength in her right leg.  Has some low back pain.  Is not doing any exercises or strengthening.  Regularly menstruating. Vitamins are making her constipated.  Review of Systems  Constitutional: Negative for fever and chills.  Respiratory: Negative for chest tightness, shortness of breath and wheezing.   Cardiovascular: Negative for palpitations and leg swelling.  Gastrointestinal: Positive for constipation (with medication). Negative for nausea, blood in stool and abdominal distention.  Genitourinary: Negative for vaginal bleeding, vaginal discharge and menstrual problem.  Musculoskeletal: Positive for back pain. Negative for myalgias.  Neurological: Negative for seizures.  Psychiatric/Behavioral: Negative for behavioral problems.       Objective:   Physical Exam  Nursing note and vitals reviewed. Constitutional: She appears well-developed and well-nourished.  HENT:  Head: Normocephalic and atraumatic.  Cardiovascular: Normal rate.   Pulmonary/Chest: Effort normal.  Abdominal: Soft. There is no tenderness.          Assessment & Plan:  Mammo today Flu and Tdap STD screen. Too early for colon screening or bone density testing. Try to get to dentist.

## 2010-12-31 LAB — GC/CHLAMYDIA PROBE AMP, URINE
Chlamydia, Swab/Urine, PCR: NEGATIVE
GC Probe Amp, Urine: NEGATIVE

## 2011-02-05 ENCOUNTER — Telehealth: Payer: Self-pay | Admitting: Family Medicine

## 2011-02-05 NOTE — Telephone Encounter (Signed)
Pt informed of negative results. [name]  

## 2011-02-05 NOTE — Telephone Encounter (Signed)
Text were negative, unable to reach patient LMOVM for pt to call back. Fleeger, Maryjo Rochester

## 2011-02-05 NOTE — Telephone Encounter (Signed)
Pt is asking for results of her last tests

## 2011-02-21 ENCOUNTER — Telehealth: Payer: Self-pay | Admitting: Family Medicine

## 2011-02-21 NOTE — Telephone Encounter (Signed)
Spoke with patient and informed her that HIV and RPR werent done at that visit bc she left prior to labs being drawn. Patient requests for me to mail her the results of her GC/Chlamydia. Information mailed.

## 2011-02-21 NOTE — Telephone Encounter (Signed)
Pt had requested HIV test and other labs when she was here in December.  She had blood drawn and she hasn't heard anything about results and wants to see the labs.  pls advise

## 2011-05-12 ENCOUNTER — Ambulatory Visit: Payer: Self-pay

## 2011-05-19 ENCOUNTER — Ambulatory Visit (INDEPENDENT_AMBULATORY_CARE_PROVIDER_SITE_OTHER): Payer: Self-pay | Admitting: *Deleted

## 2011-05-19 VITALS — Temp 98.2°F

## 2011-05-19 DIAGNOSIS — Z23 Encounter for immunization: Secondary | ICD-10-CM

## 2011-05-19 DIAGNOSIS — Z111 Encounter for screening for respiratory tuberculosis: Secondary | ICD-10-CM

## 2011-05-22 ENCOUNTER — Ambulatory Visit (INDEPENDENT_AMBULATORY_CARE_PROVIDER_SITE_OTHER): Payer: Self-pay | Admitting: *Deleted

## 2011-05-22 DIAGNOSIS — Z111 Encounter for screening for respiratory tuberculosis: Secondary | ICD-10-CM

## 2011-05-22 DIAGNOSIS — IMO0001 Reserved for inherently not codable concepts without codable children: Secondary | ICD-10-CM

## 2011-05-22 LAB — TB SKIN TEST: Induration: 0

## 2011-06-30 ENCOUNTER — Encounter: Payer: Self-pay | Admitting: Family Medicine

## 2011-11-10 ENCOUNTER — Other Ambulatory Visit (HOSPITAL_COMMUNITY): Payer: Self-pay | Admitting: *Deleted

## 2011-11-10 ENCOUNTER — Other Ambulatory Visit: Payer: Self-pay | Admitting: Family Medicine

## 2011-11-10 DIAGNOSIS — Z1231 Encounter for screening mammogram for malignant neoplasm of breast: Secondary | ICD-10-CM

## 2012-01-05 ENCOUNTER — Ambulatory Visit (HOSPITAL_COMMUNITY): Payer: Self-pay

## 2012-01-15 ENCOUNTER — Encounter (HOSPITAL_COMMUNITY): Payer: Self-pay

## 2012-01-15 ENCOUNTER — Ambulatory Visit (HOSPITAL_COMMUNITY)
Admission: RE | Admit: 2012-01-15 | Discharge: 2012-01-15 | Disposition: A | Discharge status: Home / Self Care | Payer: Self-pay | Source: Ambulatory Visit | Attending: *Deleted | Admitting: *Deleted

## 2012-01-15 DIAGNOSIS — Z1231 Encounter for screening mammogram for malignant neoplasm of breast: Secondary | ICD-10-CM

## 2012-04-21 ENCOUNTER — Ambulatory Visit (INDEPENDENT_AMBULATORY_CARE_PROVIDER_SITE_OTHER): Payer: No Typology Code available for payment source | Admitting: *Deleted

## 2012-04-21 DIAGNOSIS — Z111 Encounter for screening for respiratory tuberculosis: Secondary | ICD-10-CM

## 2012-04-23 ENCOUNTER — Ambulatory Visit (INDEPENDENT_AMBULATORY_CARE_PROVIDER_SITE_OTHER): Payer: No Typology Code available for payment source | Admitting: *Deleted

## 2012-04-23 DIAGNOSIS — Z111 Encounter for screening for respiratory tuberculosis: Secondary | ICD-10-CM

## 2012-04-23 LAB — TB SKIN TEST
Induration: 0 mm
TB Skin Test: NEGATIVE

## 2012-05-03 ENCOUNTER — Encounter: Payer: Self-pay | Admitting: Family Medicine

## 2012-06-04 ENCOUNTER — Other Ambulatory Visit (HOSPITAL_COMMUNITY)
Admission: RE | Admit: 2012-06-04 | Discharge: 2012-06-04 | Disposition: A | Discharge status: Home / Self Care | Payer: No Typology Code available for payment source | Source: Ambulatory Visit | Attending: Family Medicine | Admitting: Family Medicine

## 2012-06-04 ENCOUNTER — Ambulatory Visit (INDEPENDENT_AMBULATORY_CARE_PROVIDER_SITE_OTHER): Payer: No Typology Code available for payment source | Admitting: Family Medicine

## 2012-06-04 ENCOUNTER — Encounter: Payer: Self-pay | Admitting: Family Medicine

## 2012-06-04 VITALS — BP 112/65 | HR 79 | Temp 98.3°F | Ht 64.0 in | Wt 194.0 lb

## 2012-06-04 DIAGNOSIS — Z124 Encounter for screening for malignant neoplasm of cervix: Secondary | ICD-10-CM | POA: Insufficient documentation

## 2012-06-04 DIAGNOSIS — R5381 Other malaise: Secondary | ICD-10-CM

## 2012-06-04 DIAGNOSIS — R5383 Other fatigue: Secondary | ICD-10-CM

## 2012-06-04 DIAGNOSIS — Z1151 Encounter for screening for human papillomavirus (HPV): Secondary | ICD-10-CM | POA: Insufficient documentation

## 2012-06-04 DIAGNOSIS — Z0000 Encounter for general adult medical examination without abnormal findings: Secondary | ICD-10-CM

## 2012-06-04 DIAGNOSIS — M4712 Other spondylosis with myelopathy, cervical region: Secondary | ICD-10-CM

## 2012-06-04 DIAGNOSIS — Z01419 Encounter for gynecological examination (general) (routine) without abnormal findings: Secondary | ICD-10-CM | POA: Insufficient documentation

## 2012-06-04 DIAGNOSIS — Z113 Encounter for screening for infections with a predominantly sexual mode of transmission: Secondary | ICD-10-CM

## 2012-06-04 LAB — COMPREHENSIVE METABOLIC PANEL
BUN: 10 mg/dL (ref 6–23)
CO2: 28 mEq/L (ref 19–32)
Calcium: 9.3 mg/dL (ref 8.4–10.5)
Chloride: 99 mEq/L (ref 96–112)
Creat: 0.69 mg/dL (ref 0.50–1.10)
Glucose, Bld: 97 mg/dL (ref 70–99)

## 2012-06-04 LAB — LIPID PANEL
Cholesterol: 142 mg/dL (ref 0–200)
HDL: 61 mg/dL (ref >39)
Total CHOL/HDL Ratio: 2.3 Ratio
Triglycerides: 117 mg/dL (ref <150)

## 2012-06-04 LAB — CBC
MCV: 89 fL (ref 78.0–100.0)
Platelets: 251 10*3/uL (ref 150–400)
RBC: 4.17 MIL/uL (ref 3.87–5.11)
WBC: 7.7 10*3/uL (ref 4.0–10.5)

## 2012-06-04 LAB — TSH: TSH: 1.448 u[IU]/mL (ref 0.350–4.500)

## 2012-06-04 NOTE — Patient Instructions (Addendum)
Preventive Care for Adults, Female A healthy lifestyle and preventive care can promote health and wellness. Preventive health guidelines for women include the following key practices.  A routine yearly physical is a good way to check with your caregiver about your health and preventive screening. It is a chance to share any concerns and updates on your health, and to receive a thorough exam.  Visit your dentist for a routine exam and preventive care every 6 months. Brush your teeth twice a day and floss once a day. Good oral hygiene prevents tooth decay and gum disease.  The frequency of eye exams is based on your age, health, family medical history, use of contact lenses, and other factors. Follow your caregiver's recommendations for frequency of eye exams.  Eat a healthy diet. Foods like vegetables, fruits, whole grains, low-fat dairy products, and lean protein foods contain the nutrients you need without too many calories. Decrease your intake of foods high in solid fats, added sugars, and salt. Eat the right amount of calories for you.Get information about a proper diet from your caregiver, if necessary.  Regular physical exercise is one of the most important things you can do for your health. Most adults should get at least 150 minutes of moderate-intensity exercise (any activity that increases your heart rate and causes you to sweat) each week. In addition, most adults need muscle-strengthening exercises on 2 or more days a week.  Maintain a healthy weight. The body mass index (BMI) is a screening tool to identify possible weight problems. It provides an estimate of body fat based on height and weight. Your caregiver can help determine your BMI, and can help you achieve or maintain a healthy weight.For adults 20 years and older:  A BMI below 18.5 is considered underweight.  A BMI of 18.5 to 24.9 is normal.  A BMI of 25 to 29.9 is considered overweight.  A BMI of 30 and above is  considered obese.  Maintain normal blood lipids and cholesterol levels by exercising and minimizing your intake of saturated fat. Eat a balanced diet with plenty of fruit and vegetables. Blood tests for lipids and cholesterol should begin at age 20 and be repeated every 5 years. If your lipid or cholesterol levels are high, you are over 50, or you are at high risk for heart disease, you may need your cholesterol levels checked more frequently.Ongoing high lipid and cholesterol levels should be treated with medicines if diet and exercise are not effective.  If you smoke, find out from your caregiver how to quit. If you do not use tobacco, do not start.  If you are pregnant, do not drink alcohol. If you are breastfeeding, be very cautious about drinking alcohol. If you are not pregnant and choose to drink alcohol, do not exceed 1 drink per day. One drink is considered to be 12 ounces (355 mL) of beer, 5 ounces (148 mL) of wine, or 1.5 ounces (44 mL) of liquor.  Avoid use of street drugs. Do not share needles with anyone. Ask for help if you need support or instructions about stopping the use of drugs.  High blood pressure causes heart disease and increases the risk of stroke. Your blood pressure should be checked at least every 1 to 2 years. Ongoing high blood pressure should be treated with medicines if weight loss and exercise are not effective.  If you are 55 to 50 years old, ask your caregiver if you should take aspirin to prevent strokes.  Diabetes   screening involves taking a blood sample to check your fasting blood sugar level. This should be done once every 3 years, after age 45, if you are within normal weight and without risk factors for diabetes. Testing should be considered at a younger age or be carried out more frequently if you are overweight and have at least 1 risk factor for diabetes.  Breast cancer screening is essential preventive care for women. You should practice "breast  self-awareness." This means understanding the normal appearance and feel of your breasts and may include breast self-examination. Any changes detected, no matter how small, should be reported to a caregiver. Women in their 20s and 30s should have a clinical breast exam (CBE) by a caregiver as part of a regular health exam every 1 to 3 years. After age 40, women should have a CBE every year. Starting at age 40, women should consider having a mammography (breast X-ray test) every year. Women who have a family history of breast cancer should talk to their caregiver about genetic screening. Women at a high risk of breast cancer should talk to their caregivers about having magnetic resonance imaging (MRI) and a mammography every year.  The Pap test is a screening test for cervical cancer. A Pap test can show cell changes on the cervix that might become cervical cancer if left untreated. A Pap test is a procedure in which cells are obtained and examined from the lower end of the uterus (cervix).  Women should have a Pap test starting at age 21.  Between ages 21 and 29, Pap tests should be repeated every 2 years.  Beginning at age 30, you should have a Pap test every 3 years as long as the past 3 Pap tests have been normal.  Some women have medical problems that increase the chance of getting cervical cancer. Talk to your caregiver about these problems. It is especially important to talk to your caregiver if a new problem develops soon after your last Pap test. In these cases, your caregiver may recommend more frequent screening and Pap tests.  The above recommendations are the same for women who have or have not gotten the vaccine for human papillomavirus (HPV).  If you had a hysterectomy for a problem that was not cancer or a condition that could lead to cancer, then you no longer need Pap tests. Even if you no longer need a Pap test, a regular exam is a good idea to make sure no other problems are  starting.  If you are between ages 65 and 70, and you have had normal Pap tests going back 10 years, you no longer need Pap tests. Even if you no longer need a Pap test, a regular exam is a good idea to make sure no other problems are starting.  If you have had past treatment for cervical cancer or a condition that could lead to cancer, you need Pap tests and screening for cancer for at least 20 years after your treatment.  If Pap tests have been discontinued, risk factors (such as a new sexual partner) need to be reassessed to determine if screening should be resumed.  The HPV test is an additional test that may be used for cervical cancer screening. The HPV test looks for the virus that can cause the cell changes on the cervix. The cells collected during the Pap test can be tested for HPV. The HPV test could be used to screen women aged 30 years and older, and should   be used in women of any age who have unclear Pap test results. After the age of 30, women should have HPV testing at the same frequency as a Pap test.  Colorectal cancer can be detected and often prevented. Most routine colorectal cancer screening begins at the age of 50 and continues through age 75. However, your caregiver may recommend screening at an earlier age if you have risk factors for colon cancer. On a yearly basis, your caregiver may provide home test kits to check for hidden blood in the stool. Use of a small camera at the end of a tube, to directly examine the colon (sigmoidoscopy or colonoscopy), can detect the earliest forms of colorectal cancer. Talk to your caregiver about this at age 50, when routine screening begins. Direct examination of the colon should be repeated every 5 to 10 years through age 75, unless early forms of pre-cancerous polyps or small growths are found.  Hepatitis C blood testing is recommended for all people born from 1945 through 1965 and any individual with known risks for hepatitis C.  Practice  safe sex. Use condoms and avoid high-risk sexual practices to reduce the spread of sexually transmitted infections (STIs). STIs include gonorrhea, chlamydia, syphilis, trichomonas, herpes, HPV, and human immunodeficiency virus (HIV). Herpes, HIV, and HPV are viral illnesses that have no cure. They can result in disability, cancer, and death. Sexually active women aged 25 and younger should be checked for chlamydia. Older women with new or multiple partners should also be tested for chlamydia. Testing for other STIs is recommended if you are sexually active and at increased risk.  Osteoporosis is a disease in which the bones lose minerals and strength with aging. This can result in serious bone fractures. The risk of osteoporosis can be identified using a bone density scan. Women ages 65 and over and women at risk for fractures or osteoporosis should discuss screening with their caregivers. Ask your caregiver whether you should take a calcium supplement or vitamin D to reduce the rate of osteoporosis.  Menopause can be associated with physical symptoms and risks. Hormone replacement therapy is available to decrease symptoms and risks. You should talk to your caregiver about whether hormone replacement therapy is right for you.  Use sunscreen with sun protection factor (SPF) of 30 or more. Apply sunscreen liberally and repeatedly throughout the day. You should seek shade when your shadow is shorter than you. Protect yourself by wearing long sleeves, pants, a wide-brimmed hat, and sunglasses year round, whenever you are outdoors.  Once a month, do a whole body skin exam, using a mirror to look at the skin on your back. Notify your caregiver of new moles, moles that have irregular borders, moles that are larger than a pencil eraser, or moles that have changed in shape or color.  Stay current with required immunizations.  Influenza. You need a dose every fall (or winter). The composition of the flu vaccine  changes each year, so being vaccinated once is not enough.  Pneumococcal polysaccharide. You need 1 to 2 doses if you smoke cigarettes or if you have certain chronic medical conditions. You need 1 dose at age 65 (or older) if you have never been vaccinated.  Tetanus, diphtheria, pertussis (Tdap, Td). Get 1 dose of Tdap vaccine if you are younger than age 65, are over 65 and have contact with an infant, are a healthcare worker, are pregnant, or simply want to be protected from whooping cough. After that, you need a Td   booster dose every 10 years. Consult your caregiver if you have not had at least 3 tetanus and diphtheria-containing shots sometime in your life or have a deep or dirty wound.  HPV. You need this vaccine if you are a woman age 26 or younger. The vaccine is given in 3 doses over 6 months.  Measles, mumps, rubella (MMR). You need at least 1 dose of MMR if you were born in 1957 or later. You may also need a second dose.  Meningococcal. If you are age 19 to 21 and a first-year college student living in a residence hall, or have one of several medical conditions, you need to get vaccinated against meningococcal disease. You may also need additional booster doses.  Zoster (shingles). If you are age 60 or older, you should get this vaccine.  Varicella (chickenpox). If you have never had chickenpox or you were vaccinated but received only 1 dose, talk to your caregiver to find out if you need this vaccine.  Hepatitis A. You need this vaccine if you have a specific risk factor for hepatitis A virus infection or you simply wish to be protected from this disease. The vaccine is usually given as 2 doses, 6 to 18 months apart.  Hepatitis B. You need this vaccine if you have a specific risk factor for hepatitis B virus infection or you simply wish to be protected from this disease. The vaccine is given in 3 doses, usually over 6 months. Preventive Services / Frequency Ages 19 to 39  Blood  pressure check.** / Every 1 to 2 years.  Lipid and cholesterol check.** / Every 5 years beginning at age 20.  Clinical breast exam.** / Every 3 years for women in their 20s and 30s.  Pap test.** / Every 2 years from ages 21 through 29. Every 3 years starting at age 30 through age 65 or 70 with a history of 3 consecutive normal Pap tests.  HPV screening.** / Every 3 years from ages 30 through ages 65 to 70 with a history of 3 consecutive normal Pap tests.  Hepatitis C blood test.** / For any individual with known risks for hepatitis C.  Skin self-exam. / Monthly.  Influenza immunization.** / Every year.  Pneumococcal polysaccharide immunization.** / 1 to 2 doses if you smoke cigarettes or if you have certain chronic medical conditions.  Tetanus, diphtheria, pertussis (Tdap, Td) immunization. / A one-time dose of Tdap vaccine. After that, you need a Td booster dose every 10 years.  HPV immunization. / 3 doses over 6 months, if you are 26 and younger.  Measles, mumps, rubella (MMR) immunization. / You need at least 1 dose of MMR if you were born in 1957 or later. You may also need a second dose.  Meningococcal immunization. / 1 dose if you are age 19 to 21 and a first-year college student living in a residence hall, or have one of several medical conditions, you need to get vaccinated against meningococcal disease. You may also need additional booster doses.  Varicella immunization.** / Consult your caregiver.  Hepatitis A immunization.** / Consult your caregiver. 2 doses, 6 to 18 months apart.  Hepatitis B immunization.** / Consult your caregiver. 3 doses usually over 6 months. Ages 40 to 64  Blood pressure check.** / Every 1 to 2 years.  Lipid and cholesterol check.** / Every 5 years beginning at age 20.  Clinical breast exam.** / Every year after age 40.  Mammogram.** / Every year beginning at age 40   and continuing for as long as you are in good health. Consult with your  caregiver.  Pap test.** / Every 3 years starting at age 30 through age 65 or 70 with a history of 3 consecutive normal Pap tests.  HPV screening.** / Every 3 years from ages 30 through ages 65 to 70 with a history of 3 consecutive normal Pap tests.  Fecal occult blood test (FOBT) of stool. / Every year beginning at age 50 and continuing until age 75. You may not need to do this test if you get a colonoscopy every 10 years.  Flexible sigmoidoscopy or colonoscopy.** / Every 5 years for a flexible sigmoidoscopy or every 10 years for a colonoscopy beginning at age 50 and continuing until age 75.  Hepatitis C blood test.** / For all people born from 1945 through 1965 and any individual with known risks for hepatitis C.  Skin self-exam. / Monthly.  Influenza immunization.** / Every year.  Pneumococcal polysaccharide immunization.** / 1 to 2 doses if you smoke cigarettes or if you have certain chronic medical conditions.  Tetanus, diphtheria, pertussis (Tdap, Td) immunization.** / A one-time dose of Tdap vaccine. After that, you need a Td booster dose every 10 years.  Measles, mumps, rubella (MMR) immunization. / You need at least 1 dose of MMR if you were born in 1957 or later. You may also need a second dose.  Varicella immunization.** / Consult your caregiver.  Meningococcal immunization.** / Consult your caregiver.  Hepatitis A immunization.** / Consult your caregiver. 2 doses, 6 to 18 months apart.  Hepatitis B immunization.** / Consult your caregiver. 3 doses, usually over 6 months. Ages 65 and over  Blood pressure check.** / Every 1 to 2 years.  Lipid and cholesterol check.** / Every 5 years beginning at age 20.  Clinical breast exam.** / Every year after age 40.  Mammogram.** / Every year beginning at age 40 and continuing for as long as you are in good health. Consult with your caregiver.  Pap test.** / Every 3 years starting at age 30 through age 65 or 70 with a 3  consecutive normal Pap tests. Testing can be stopped between 65 and 70 with 3 consecutive normal Pap tests and no abnormal Pap or HPV tests in the past 10 years.  HPV screening.** / Every 3 years from ages 30 through ages 65 or 70 with a history of 3 consecutive normal Pap tests. Testing can be stopped between 65 and 70 with 3 consecutive normal Pap tests and no abnormal Pap or HPV tests in the past 10 years.  Fecal occult blood test (FOBT) of stool. / Every year beginning at age 50 and continuing until age 75. You may not need to do this test if you get a colonoscopy every 10 years.  Flexible sigmoidoscopy or colonoscopy.** / Every 5 years for a flexible sigmoidoscopy or every 10 years for a colonoscopy beginning at age 50 and continuing until age 75.  Hepatitis C blood test.** / For all people born from 1945 through 1965 and any individual with known risks for hepatitis C.  Osteoporosis screening.** / A one-time screening for women ages 65 and over and women at risk for fractures or osteoporosis.  Skin self-exam. / Monthly.  Influenza immunization.** / Every year.  Pneumococcal polysaccharide immunization.** / 1 dose at age 65 (or older) if you have never been vaccinated.  Tetanus, diphtheria, pertussis (Tdap, Td) immunization. / A one-time dose of Tdap vaccine if you are over   65 and have contact with an infant, are a healthcare worker, or simply want to be protected from whooping cough. After that, you need a Td booster dose every 10 years.  Varicella immunization.** / Consult your caregiver.  Meningococcal immunization.** / Consult your caregiver.  Hepatitis A immunization.** / Consult your caregiver. 2 doses, 6 to 18 months apart.  Hepatitis B immunization.** / Check with your caregiver. 3 doses, usually over 6 months. ** Family history and personal history of risk and conditions may change your caregiver's recommendations. Document Released: 03/11/2001 Document Revised: 04/07/2011  Document Reviewed: 06/10/2010 [employer] Patient Information 2013 [employer].  

## 2012-06-04 NOTE — Progress Notes (Signed)
Subjective:    Patient ID: [name], female    DOB: 09-15-1960, 52 y.o.   MRN: [id number]  Back Pain This is a chronic problem. The current episode started more than 1 year ago. The problem occurs daily. The problem is unchanged. The pain is present in the lumbar spine. The quality of the pain is described as aching. The pain does not radiate. The pain is mild. Pertinent negatives include no abdominal pain, chest pain, dysuria, fever or headaches. She has tried nothing for the symptoms.    Here for CPE.  Is walking one day a week.  Notes low back pain since her surgery, neck fusion and is wanting to see her Neurosurgeon back for this.  It is low, dull and not worse with movement.  Reports that it makes her tired.  She notes congestion and sinuses which she takes nothing for and reports some ear noise when blowing her nose.  Sinuses are not worse with pollen. History reviewed. No pertinent past medical history. Past Surgical History  Procedure Laterality Date  . Cervical fusion  2012   No current outpatient prescriptions on file prior to visit.   No current facility-administered medications on file prior to visit.   No Known Allergies  Family History  Problem Relation Age of Onset  . Diabetes Mother   . Hypertension Mother   . Cancer Maternal Grandmother     pancreas  . Hypertension Maternal Grandmother    History   Social History  . Marital Status: Single    Spouse Name: N/A    Number of Children: N/A  . Years of Education: N/A   Occupational History  . Not on file.   Social History Main Topics  . Smoking status: Never Smoker   . Smokeless tobacco: Not on file  . Alcohol Use: 2.4 oz/week    4 Glasses of wine per week  . Drug Use: No  . Sexually Active: Not on file   Other Topics Concern  . Not on file   Social History Narrative  . No narrative on file     Review of Systems  Constitutional: Negative for fever and chills.  HENT: Positive for congestion and  sinus pressure. Negative for nosebleeds and rhinorrhea.   Eyes: Negative for visual disturbance.  Respiratory: Negative for chest tightness and shortness of breath.   Cardiovascular: Negative for chest pain.  Gastrointestinal: Negative for nausea, vomiting, abdominal pain, diarrhea, constipation, blood in stool and abdominal distention.  Genitourinary: Negative for dysuria, frequency, vaginal bleeding and menstrual problem.  Musculoskeletal: Positive for back pain. Negative for arthralgias.  Skin: Negative for rash.  Neurological: Negative for dizziness and headaches.  Psychiatric/Behavioral: Negative for behavioral problems, sleep disturbance and dysphoric mood.  All other systems reviewed and are negative.       Objective:   Physical Exam  Constitutional: She is oriented to person, place, and time. She appears well-developed and well-nourished. No distress.  HENT:  Head: Normocephalic and atraumatic.  Left Ear: Tympanic membrane normal.  Nose: Mucosal edema present.  Mouth/Throat: Oropharynx is clear and moist.  Cardiovascular: Normal rate and regular rhythm.   No murmur heard. Pulmonary/Chest: Effort normal and breath sounds normal. She has no wheezes. Right breast exhibits no mass and no tenderness. Left breast exhibits no mass and no tenderness.  Abdominal: Soft. She exhibits no mass. There is no tenderness. There is no rebound.  Genitourinary:  NEFG, BUS is WNL, vagina is pink and ruggated, cervix is nulliparous and  without lesion, uterus is NSSC, non-tender, no adnexal mass or tenderness.  Musculoskeletal: Normal range of motion. She exhibits no edema.  Neurological: She is alert and oriented to person, place, and time.  Skin: Skin is warm and dry. No rash noted.  Psychiatric: She has a normal mood and affect.          Assessment & Plan:

## 2012-06-09 ENCOUNTER — Encounter: Payer: Self-pay | Admitting: Family Medicine

## 2012-07-20 NOTE — Assessment & Plan Note (Signed)
STD screen

## 2012-07-20 NOTE — Assessment & Plan Note (Signed)
Check annual labs

## 2012-07-20 NOTE — Assessment & Plan Note (Signed)
Pap

## 2012-07-20 NOTE — Assessment & Plan Note (Signed)
Back to NS

## 2012-10-12 ENCOUNTER — Ambulatory Visit: Payer: No Typology Code available for payment source | Admitting: Family Medicine

## 2012-10-14 ENCOUNTER — Other Ambulatory Visit (HOSPITAL_COMMUNITY): Payer: Self-pay | Admitting: *Deleted

## 2012-10-14 ENCOUNTER — Other Ambulatory Visit: Payer: Self-pay | Admitting: Family Medicine

## 2012-10-14 ENCOUNTER — Ambulatory Visit (INDEPENDENT_AMBULATORY_CARE_PROVIDER_SITE_OTHER): Payer: No Typology Code available for payment source | Admitting: Family Medicine

## 2012-10-14 ENCOUNTER — Encounter: Payer: Self-pay | Admitting: Family Medicine

## 2012-10-14 VITALS — BP 131/81 | HR 78 | Temp 98.9°F | Wt 192.0 lb

## 2012-10-14 DIAGNOSIS — Z1231 Encounter for screening mammogram for malignant neoplasm of breast: Secondary | ICD-10-CM

## 2012-10-14 DIAGNOSIS — J309 Allergic rhinitis, unspecified: Secondary | ICD-10-CM

## 2012-10-14 DIAGNOSIS — Z1211 Encounter for screening for malignant neoplasm of colon: Secondary | ICD-10-CM

## 2012-10-14 DIAGNOSIS — Z124 Encounter for screening for malignant neoplasm of cervix: Secondary | ICD-10-CM | POA: Insufficient documentation

## 2012-10-14 MED ORDER — FLUTICASONE PROPIONATE 50 MCG/ACT NA SUSP: 2.0000 | Freq: Every day | NASAL | Status: DC

## 2012-10-14 NOTE — Progress Notes (Signed)
Patient ID: OPHELIA SIPE, female   DOB: 04-25-1960, 52 y.o.   MRN: [id number]  [facility] Leahanna Buser M. Shenae Bonanno, MD Phone: [phone]   Subjective: HPI: Patient is a 52 y.o. female presenting to clinic today for follow up appointment for referral for colonoscopy. Concerns today include allergies  1. Screening for colon cancer - States she has had 2 friends younger than her die of colon cancer. She will be 52 in November and would like to get her colonoscopy then. She denies changes in bowel habits, blood in stool, abd pain, unexpected weight loss.   2. Allergies - Has long standing history of allergic rhinitis. Never taken any medication for this. She states she has nasal congestion, clogged ears and itchy eyes. Does not bother her except during season cahnges.  History Reviewed: Non-smoker. Health Maintenance: Needs flu shot, will ask her job about it  ROS: Please see HPI above.  Objective: Office vital signs reviewed. BP 131/81  Pulse 78  Temp(Src) 98.9 F (37.2 C) (Oral)  Wt 192 lb (87.091 kg)  BMI 32.94 kg/m2  LMP 09/15/2012  Physical Examination:  General: Awake, alert. NAD HEENT: Atraumatic, normocephalic. Frequent sniffling.  Neck: No masses palpated. No LAD Pulm: CTAB, no wheezes Cardio: RRR, no murmurs appreciated Abdomen:+BS, soft, nontender, nondistended Extremities: No edema Neuro: Grossly intact  Assessment: 52 y.o. female follow up  Plan: See Problem List and After Visit Summary

## 2012-10-14 NOTE — Patient Instructions (Addendum)
We will call you with your appointment for the screening colonoscopy.  Use nasal spray in the morning and allergy medicine at night.  [name] M. Joshawn Crissman, M.D.  Allergic Rhinitis Allergic rhinitis is when the mucous membranes in the nose respond to allergens. Allergens are particles in the air that cause your body to have an allergic reaction. This causes you to release allergic antibodies. Through a chain of events, these eventually cause you to release histamine into the blood stream (hence the use of antihistamines). Although meant to be protective to the body, it is this release that causes your discomfort, such as frequent sneezing, congestion and an itchy runny nose.  CAUSES  The pollen allergens may come from grasses, trees, and weeds. This is seasonal allergic rhinitis, or "hay fever." Other allergens cause year-round allergic rhinitis (perennial allergic rhinitis) such as house dust mite allergen, pet dander and mold spores.  SYMPTOMS   Nasal stuffiness (congestion).  Runny, itchy nose with sneezing and tearing of the eyes.  There is often an itching of the mouth, eyes and ears. It cannot be cured, but it can be controlled with medications. DIAGNOSIS  If you are unable to determine the offending allergen, skin or blood testing may find it. TREATMENT   Avoid the allergen.  Medications and allergy shots (immunotherapy) can help.  Hay fever may often be treated with antihistamines in pill or nasal spray forms. Antihistamines block the effects of histamine. There are over-the-counter medicines that may help with nasal congestion and swelling around the eyes. Check with your caregiver before taking or giving this medicine. If the treatment above does not work, there are many new medications your caregiver can prescribe. Stronger medications may be used if initial measures are ineffective. Desensitizing injections can be used if medications and avoidance fails. Desensitization is when a  patient is given ongoing shots until the body becomes less sensitive to the allergen. Make sure you follow up with your caregiver if problems continue. SEEK MEDICAL CARE IF:   You develop fever (more than 100.5 F (38.1 C).  You develop a cough that does not stop easily (persistent).  You have shortness of breath.  You start wheezing.  Symptoms interfere with normal daily activities. Document Released: 10/08/2000 Document Revised: 04/07/2011 Document Reviewed: 04/19/2008 [facility] Patient Information 2014 [place], [place].

## 2012-10-14 NOTE — Assessment & Plan Note (Signed)
Referral placed but she will wait until after her 52th bday to schedule it.

## 2012-10-14 NOTE — Assessment & Plan Note (Signed)
Long standing problem. Will send Flonase to pharmacy in [place] for her to use qam. Take OTC allergy medication at night. Return if symptoms worsen.

## 2012-10-29 ENCOUNTER — Encounter: Payer: Self-pay | Admitting: Internal Medicine

## 2013-01-07 ENCOUNTER — Ambulatory Visit (AMBULATORY_SURGERY_CENTER): Payer: Self-pay

## 2013-01-07 VITALS — Ht 64.0 in | Wt 192.0 lb

## 2013-01-07 DIAGNOSIS — Z1211 Encounter for screening for malignant neoplasm of colon: Secondary | ICD-10-CM

## 2013-01-07 MED ORDER — SUPREP BOWEL PREP KIT 17.5-3.13-1.6 GM/177ML PO SOLN: 1.0000 | Freq: Once | ORAL | Status: DC

## 2013-01-11 ENCOUNTER — Telehealth: Payer: Self-pay

## 2013-01-11 NOTE — Telephone Encounter (Signed)
[facility] requesting prior authorization for suprep, left message for patient to call me so we can discuss options.

## 2013-01-11 NOTE — Telephone Encounter (Signed)
Patient phoned back and she now lives in [place] not [place] so not close by to come over and pick up a suprep.  She has a coupon and will use that to purchase the suprep.

## 2013-01-13 ENCOUNTER — Encounter: Payer: No Typology Code available for payment source | Admitting: Internal Medicine

## 2013-01-17 ENCOUNTER — Ambulatory Visit (HOSPITAL_COMMUNITY): Payer: No Typology Code available for payment source

## 2013-01-18 ENCOUNTER — Ambulatory Visit (HOSPITAL_COMMUNITY)
Admission: RE | Admit: 2013-01-18 | Discharge: 2013-01-18 | Disposition: A | Discharge status: Home / Self Care | Payer: No Typology Code available for payment source | Source: Ambulatory Visit | Attending: Family Medicine | Admitting: Family Medicine

## 2013-01-18 DIAGNOSIS — Z1231 Encounter for screening mammogram for malignant neoplasm of breast: Secondary | ICD-10-CM | POA: Insufficient documentation

## 2013-01-24 ENCOUNTER — Telehealth: Payer: Self-pay | Admitting: Internal Medicine

## 2013-01-24 NOTE — Telephone Encounter (Signed)
Sample of prep obtained for pt and spoke with pt regarding this.  She will pick up prep today

## 2013-01-26 ENCOUNTER — Encounter: Payer: No Typology Code available for payment source | Admitting: Internal Medicine

## 2013-01-26 ENCOUNTER — Encounter: Payer: Self-pay | Admitting: Internal Medicine

## 2013-01-26 ENCOUNTER — Ambulatory Visit (AMBULATORY_SURGERY_CENTER): Payer: No Typology Code available for payment source | Admitting: Internal Medicine

## 2013-01-26 VITALS — BP 123/68 | HR 68 | Temp 98.6°F | Resp 16 | Ht 64.0 in | Wt 192.0 lb

## 2013-01-26 DIAGNOSIS — Z1211 Encounter for screening for malignant neoplasm of colon: Secondary | ICD-10-CM

## 2013-01-26 MED ORDER — SODIUM CHLORIDE 0.9 % IV SOLN: 500.0000 mL | INTRAVENOUS | Status: DC

## 2013-01-26 NOTE — Patient Instructions (Addendum)
Your colonoscopy was normal!  Next routine colonoscopy in 10 years - 2024.  I appreciate the opportunity to care for you. Iva Boop, MD, FACG   YOU HAD AN ENDOSCOPIC PROCEDURE TODAY AT THE [facility]: Refer to the procedure report that was given to you for any specific questions about what was found during the examination.  If the procedure report does not answer your questions, please call your gastroenterologist to clarify.  If you requested that your care partner not be given the details of your procedure findings, then the procedure report has been included in a sealed envelope for you to review at your convenience later.  YOU SHOULD EXPECT: Some feelings of bloating in the abdomen. Passage of more gas than usual.  Walking can help get rid of the air that was put into your GI tract during the procedure and reduce the bloating. If you had a lower endoscopy (such as a colonoscopy or flexible sigmoidoscopy) you may notice spotting of blood in your stool or on the toilet paper. If you underwent a bowel prep for your procedure, then you may not have a normal bowel movement for a few days.  DIET: Your first meal following the procedure should be a light meal and then it is ok to progress to your normal diet.  A half-sandwich or bowl of soup is an example of a good first meal.  Heavy or fried foods are harder to digest and may make you feel nauseous or bloated.  Likewise meals heavy in dairy and vegetables can cause extra gas to form and this can also increase the bloating.  Drink plenty of fluids but you should avoid alcoholic beverages for 24 hours.  ACTIVITY: Your care partner should take you home directly after the procedure.  You should plan to take it easy, moving slowly for the rest of the day.  You can resume normal activity the day after the procedure however you should NOT DRIVE or use heavy machinery for 24 hours (because of the sedation medicines used during the test).     SYMPTOMS TO REPORT IMMEDIATELY: A gastroenterologist can be reached at any hour.  During normal business hours, 8:30 AM to 5:00 PM Monday through Friday, call [phone].  After hours and on weekends, please call the GI answering service at [phone] who will take a message and have the physician on call contact you.   Following lower endoscopy (colonoscopy or flexible sigmoidoscopy):  Excessive amounts of blood in the stool  Significant tenderness or worsening of abdominal pains  Swelling of the abdomen that is new, acute  Fever of 100F or higher  Following upper endoscopy (EGD)  Vomiting of blood or coffee ground material  New chest pain or pain under the shoulder blades  Painful or persistently difficult swallowing  New shortness of breath  Fever of 100F or higher  Black, tarry-looking stools  FOLLOW UP: If any biopsies were taken you will be contacted by phone or by letter within the next 1-3 weeks.  Call your gastroenterologist if you have not heard about the biopsies in 3 weeks.  Our staff will call the home number listed on your records the next business day following your procedure to check on you and address any questions or concerns that you may have at that time regarding the information given to you following your procedure. This is a courtesy call and so if there is no answer at the home number and we have not  heard from you through the emergency physician on call, we will assume that you have returned to your regular daily activities without incident.  SIGNATURES/CONFIDENTIALITY: You and/or your care partner have signed paperwork which will be entered into your electronic medical record.  These signatures attest to the fact that that the information above on your After Visit Summary has been reviewed and is understood.  Full responsibility of the confidentiality of this discharge information lies with you and/or your care-partner.

## 2013-01-26 NOTE — Op Note (Signed)
[facility] [address] [place], [address]   COLONOSCOPY PROCEDURE REPORT  PATIENT: [name], [name]  MR#: [id number] BIRTHDATE: 1960-12-23 , 52  yrs. old GENDER: Female ENDOSCOPIST: Iva Boop, MD, [facility] REFERRED XB:JYNWG Pratt, M.D. PROCEDURE DATE:  01/26/2013 PROCEDURE:   Colonoscopy, screening First Screening Colonoscopy - Avg.  risk and is 50 yrs.  old or older Yes.  Prior Negative Screening - Now for repeat screening. N/A  History of Adenoma - Now for follow-up colonoscopy & has been > or = to 3 yrs.  N/A  Polyps Removed Today? No.  Recommend repeat exam, <10 yrs? No. ASA CLASS:   Class I INDICATIONS:average risk screening and first colonoscopy. MEDICATIONS: propofol (Diprivan) 250mg  IV, MAC sedation, administered by CRNA, and These medications were titrated to patient response per physician's verbal order  DESCRIPTION OF PROCEDURE:   After the risks benefits and alternatives of the procedure were thoroughly explained, informed consent was obtained.  A digital rectal exam revealed no abnormalities of the rectum.   The LB PFC-H190 [id number]  endoscope was introduced through the anus and advanced to the cecum, which was identified by both the appendix and ileocecal valve. No adverse events experienced.   The quality of the prep was excellent using Suprep  The instrument was then slowly withdrawn as the colon was fully examined.      COLON FINDINGS: A normal appearing cecum, ileocecal valve, and appendiceal orifice were identified.  The ascending, hepatic flexure, transverse, splenic flexure, descending, sigmoid colon and rectum appeared unremarkable.  No polyps or cancers were seen.   A right colon retroflexion was performed.  Retroflexed views revealed no abnormalities. The time to cecum=1 minutes 29 seconds. Withdrawal time=10 minutes 52 seconds.  The scope was withdrawn and the procedure completed. COMPLICATIONS: There were no  complications.  ENDOSCOPIC IMPRESSION: Normal colonoscopy - excellent prep - first screening  RECOMMENDATIONS: Repeat colonoscopy 10 years - 2024   eSigned:  Iva Boop, MD, [facility] 01/26/2013 10:56 AM   cc: Tinnie Gens, MD    and The Patient

## 2013-01-26 NOTE — Progress Notes (Signed)
Pt stable to RR 

## 2013-01-28 ENCOUNTER — Telehealth: Payer: Self-pay | Admitting: *Deleted

## 2013-01-28 NOTE — Telephone Encounter (Signed)
No answer, message left for the patient. 

## 2013-02-18 ENCOUNTER — Encounter: Payer: Self-pay | Admitting: Family Medicine

## 2013-02-18 ENCOUNTER — Ambulatory Visit: Payer: No Typology Code available for payment source | Admitting: Family Medicine

## 2013-02-18 ENCOUNTER — Ambulatory Visit (INDEPENDENT_AMBULATORY_CARE_PROVIDER_SITE_OTHER): Payer: No Typology Code available for payment source | Admitting: Family Medicine

## 2013-02-18 VITALS — BP 116/78 | HR 92 | Temp 99.0°F | Ht 64.0 in | Wt 195.1 lb

## 2013-02-18 DIAGNOSIS — J069 Acute upper respiratory infection, unspecified: Secondary | ICD-10-CM

## 2013-02-18 DIAGNOSIS — J309 Allergic rhinitis, unspecified: Secondary | ICD-10-CM

## 2013-02-18 MED ORDER — AMOXICILLIN-POT CLAVULANATE 500-125 MG PO TABS: 1.0000 | ORAL_TABLET | Freq: Three times a day (TID) | ORAL | Status: DC

## 2013-02-18 NOTE — Progress Notes (Signed)
   Subjective:    Patient ID: Lauren MarekSharon D [name], female    DOB: 1960/10/06, 53 y.o.   MRN: [id number]  HPI 53 yo F same day visit for 3 days of cough, congestion, ear popping, substernal CP with cough. Sick contact at work with cough. No fever or SOB.   Tried Vicks vapor rubs and OTC cough medicine w/o relief. Not taking flonase.   Soc hx: chronic non smoker  Review of Systems As per HPI    Objective:   Physical Exam BP 116/78  Pulse 92  Temp(Src) 99 F (37.2 C) (Oral)  Ht 5\' 4"  (1.626 m)  Wt 195 lb 1.6 oz (88.497 kg)  BMI 33.47 kg/m2  SpO2 95% General appearance: alert, cooperative and no distress Eyes: conjunctivae/corneas clear. PERRL, EOM's intact.  Ears: normal TM's and external ear canals both ears, TM partially obscured by cerumen.  Nose: no discharge, moderate congestion, turbinates red, swollen Throat: lips, mucosa, and tongue normal; teeth and gums normal Lungs: clear to auscultation bilaterally Heart: regular rate and rhythm, S1, S2 normal, no murmur, click, rub or gallop     Assessment & Plan:

## 2013-02-18 NOTE — Assessment & Plan Note (Signed)
You have a viral URI with cough. For this please do the following:  1. Drink plenty of fluids. Hot tea, soup etc will help open your nasal passages. 2. Cough drops  for cough suppression 3. Mucinex/robitussin (guaifenessin) to break up secretions 4. Tylenol or motrin for pain.  5. Nasal saline mist or spray and nasals steroid (flonase) for nasal congestion.   Call for chest pain, shortness of breath, fever, symptoms lasting for 7 days or more. If syptoms last for greater than 7 days with the above therapy start Augmentin (abtibiotic).

## 2013-02-18 NOTE — Patient Instructions (Addendum)
Thank you for coming in today. You have a viral URI with cough. For this please do the following:  1. Drink plenty of fluids. Hot tea, soup etc will help open your nasal passages. 2. Cough drops  for cough suppression 3. Mucinex/robitussin (guaifenessin) to break up secretions 4. Tylenol or motrin for pain.  5. Nasal saline mist or spray and nasals steroid (flonase) for nasal congestion.   Call for chest pain, shortness of breath, fever, symptoms lasting for 7 days or more. If syptoms last for greater than 7 days with the above therapy start Augmentin (abtibiotic).    Dr. Armen PickupFunches

## 2013-03-07 ENCOUNTER — Telehealth: Payer: Self-pay | Admitting: Family Medicine

## 2013-03-07 NOTE — Telephone Encounter (Signed)
Patient will need f/u with her PCP to eval and determine if referral is necessary. Please call patient and facilitate f/u.

## 2013-03-07 NOTE — Telephone Encounter (Signed)
Pt called and would like to see a ENT doctor.jw

## 2013-03-10 ENCOUNTER — Encounter: Payer: Self-pay | Admitting: Family Medicine

## 2013-03-10 ENCOUNTER — Ambulatory Visit (INDEPENDENT_AMBULATORY_CARE_PROVIDER_SITE_OTHER): Payer: No Typology Code available for payment source | Admitting: Family Medicine

## 2013-03-10 VITALS — BP 140/70 | HR 91 | Temp 99.1°F | Wt 195.0 lb

## 2013-03-10 DIAGNOSIS — J309 Allergic rhinitis, unspecified: Secondary | ICD-10-CM

## 2013-03-10 MED ORDER — FLUTICASONE PROPIONATE 50 MCG/ACT NA SUSP: 2.0000 | Freq: Every day | NASAL | Status: DC

## 2013-03-10 MED ORDER — MONTELUKAST SODIUM 10 MG PO TABS: 10.0000 mg | ORAL_TABLET | Freq: Every day | ORAL | Status: DC

## 2013-03-10 MED ORDER — CETIRIZINE HCL 10 MG PO TABS: 10.0000 mg | ORAL_TABLET | Freq: Every day | ORAL | Status: DC

## 2013-03-10 NOTE — Progress Notes (Signed)
Patient ID: Lauren MarekSharon D Villagran, female   DOB: [birth year], 53 y.o.   MRN: [id number] Subjective:     Patient ID: Lauren MarekSharon D Vanepps, female   DOB: [birth year], 53 y.o.   MRN: [id number]  HPI 53 y.o. F recently treated for URI 3 weeks ago to include mucinex and antibiotics (augmentin). Pt reports that her chest soreness is resolved, but persistent cough and phlegm. Pt states that she still has PND and congestion. Pt reports that she is spitting.  Meds: Currently on no medication Pmhx: reports none. PShx: neck surgery 2012 Allergies: NKDA  Review of Systems No fevers, no chills, no sob. No n/v +Chest congestion, cough, sinus congestion, PND, runny nose      Objective:   Physical Exam Filed Vitals:   03/10/13 1335  BP: 140/70  Pulse: 91  Temp: 99.1 F (37.3 C)  VSS NAD No sinus tenderness No LAD COL bil Right ear with cotton in it 2/2 swab from home CTAB no wrc RRR no mgt      Assessment:     53 y.o. F with sinus congestion likely related to allergic rhintis not on treatment. Will start pt on broad allergic rhinitis regimen - flonase 2spray daily Zyrtec 10mg  qday singulain 10mg  qday May use sinus rinse PRN  Debrox gtt for ear wax Hydrogen peroxide PRN for cotton swab.  F/u 2-4 weeks for reeval prior to ENT referral.

## 2013-03-10 NOTE — Patient Instructions (Signed)
Allergic Rhinitis Allergic rhinitis is when the mucous membranes in the nose respond to allergens. Allergens are particles in the air that cause your body to have an allergic reaction. This causes you to release allergic antibodies. Through a chain of events, these eventually cause you to release histamine into the blood stream. Although meant to protect the body, it is this release of histamine that causes your discomfort, such as frequent sneezing, congestion, and an itchy, runny nose.  CAUSES  Seasonal allergic rhinitis (hay fever) is caused by pollen allergens that may come from grasses, trees, and weeds. Year-round allergic rhinitis (perennial allergic rhinitis) is caused by allergens such as house dust mites, pet dander, and mold spores.  SYMPTOMS   Nasal stuffiness (congestion).  Itchy, runny nose with sneezing and tearing of the eyes. DIAGNOSIS  Your health care provider can help you determine the allergen or allergens that trigger your symptoms. If you and your health care provider are unable to determine the allergen, skin or blood testing may be used. TREATMENT  Allergic Rhinitis does not have a cure, but it can be controlled by:  Medicines and allergy shots (immunotherapy).  Avoiding the allergen. Hay fever may often be treated with antihistamines in pill or nasal spray forms. Antihistamines block the effects of histamine. There are over-the-counter medicines that may help with nasal congestion and swelling around the eyes. Check with your health care provider before taking or giving this medicine.  If avoiding the allergen or the medicine prescribed do not work, there are many new medicines your health care provider can prescribe. Stronger medicine may be used if initial measures are ineffective. Desensitizing injections can be used if medicine and avoidance does not work. Desensitization is when a patient is given ongoing shots until the body becomes less sensitive to the allergen.  Make sure you follow up with your health care provider if problems continue. HOME CARE INSTRUCTIONS It is not possible to completely avoid allergens, but you can reduce your symptoms by taking steps to limit your exposure to them. It helps to know exactly what you are allergic to so that you can avoid your specific triggers. SEEK MEDICAL CARE IF:   You have a fever.  You develop a cough that does not stop easily (persistent).  You have shortness of breath.  You start wheezing.  Symptoms interfere with normal daily activities. Document Released: 10/08/2000 Document Revised: 11/03/2012 Document Reviewed: 09/20/2012 [employer] Patient Information 2014 [employer].  

## 2013-04-25 ENCOUNTER — Ambulatory Visit: Payer: No Typology Code available for payment source | Admitting: Family Medicine

## 2013-04-26 ENCOUNTER — Encounter: Payer: Self-pay | Admitting: Family Medicine

## 2013-04-26 ENCOUNTER — Telehealth: Payer: Self-pay | Admitting: Family Medicine

## 2013-04-26 ENCOUNTER — Ambulatory Visit (INDEPENDENT_AMBULATORY_CARE_PROVIDER_SITE_OTHER): Payer: PRIVATE HEALTH INSURANCE | Admitting: Family Medicine

## 2013-04-26 ENCOUNTER — Ambulatory Visit (HOSPITAL_COMMUNITY)
Admission: RE | Admit: 2013-04-26 | Discharge: 2013-04-26 | Disposition: A | Discharge status: Home / Self Care | Payer: No Typology Code available for payment source | Source: Ambulatory Visit | Attending: Family Medicine | Admitting: Family Medicine

## 2013-04-26 VITALS — BP 131/73 | HR 81 | Temp 98.4°F | Ht 64.0 in | Wt 199.0 lb

## 2013-04-26 DIAGNOSIS — M545 Low back pain, unspecified: Secondary | ICD-10-CM | POA: Insufficient documentation

## 2013-04-26 DIAGNOSIS — M549 Dorsalgia, unspecified: Secondary | ICD-10-CM

## 2013-04-26 MED ORDER — TRAMADOL HCL 50 MG PO TABS: 50.0000 mg | ORAL_TABLET | Freq: Three times a day (TID) | ORAL | Status: DC | PRN

## 2013-04-26 NOTE — Telephone Encounter (Signed)
To call back for test result. Xray of her spine normal.

## 2013-04-26 NOTE — Patient Instructions (Signed)
Back Pain, Adult  Back pain is very common. The pain often gets better over time. The cause of back pain is usually not dangerous. Most people can learn to manage their back pain on their own.   HOME CARE   · Stay active. Start with short walks on flat ground if you can. Try to walk farther each day.  · Do not sit, drive, or stand in one place for more than 30 minutes. Do not stay in bed.  · Do not avoid exercise or work. Activity can help your back heal faster.  · Be careful when you bend or lift an object. Bend at your knees, keep the object close to you, and do not twist.  · Sleep on a firm mattress. Lie on your side, and bend your knees. If you lie on your back, put a pillow under your knees.  · Only take medicines as told by your doctor.  · Put ice on the injured area.  · Put ice in a plastic bag.  · Place a towel between your skin and the bag.  · Leave the ice on for 15-20 minutes, 03-04 times a day for the first 2 to 3 days. After that, you can switch between ice and heat packs.  · Ask your doctor about back exercises or massage.  · Avoid feeling anxious or stressed. Find good ways to deal with stress, such as exercise.  GET HELP RIGHT AWAY IF:   · Your pain does not go away with rest or medicine.  · Your pain does not go away in 1 week.  · You have new problems.  · You do not feel well.  · The pain spreads into your legs.  · You cannot control when you poop (bowel movement) or pee (urinate).  · Your arms or legs feel weak or lose feeling (numbness).  · You feel sick to your stomach (nauseous) or throw up (vomit).  · You have belly (abdominal) pain.  · You feel like you may pass out (faint).  MAKE SURE YOU:   · Understand these instructions.  · Will watch your condition.  · Will get help right away if you are not doing well or get worse.  Document Released: 07/02/2007 Document Revised: 04/07/2011 Document Reviewed: 06/03/2010  [employer]® Patient Information ©2014 [employer].
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
## 2013-04-26 NOTE — Telephone Encounter (Signed)
Patient called back, xray report discussed with her. Plan to continue Tramadol prn pain, RTC soon if pain worsens or go to the ED. She agreed with plan and verbalized understanding.
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
## 2013-06-02 ENCOUNTER — Encounter: Payer: Self-pay | Admitting: Family Medicine

## 2013-06-02 ENCOUNTER — Ambulatory Visit (INDEPENDENT_AMBULATORY_CARE_PROVIDER_SITE_OTHER): Payer: No Typology Code available for payment source | Admitting: Family Medicine

## 2013-06-02 VITALS — BP 117/75 | HR 77 | Temp 98.6°F | Wt 197.0 lb

## 2013-06-02 DIAGNOSIS — M545 Low back pain, unspecified: Secondary | ICD-10-CM

## 2013-06-02 DIAGNOSIS — M549 Dorsalgia, unspecified: Secondary | ICD-10-CM

## 2013-06-02 DIAGNOSIS — M542 Cervicalgia: Secondary | ICD-10-CM

## 2013-06-02 MED ORDER — MELOXICAM 7.5 MG PO TABS: 7.5000 mg | ORAL_TABLET | Freq: Every day | ORAL | Status: DC

## 2013-06-02 NOTE — Assessment & Plan Note (Signed)
Still having symptoms despite negative xray result and tramadol prn. Patient stated she has been getting home exercise. PT recommended but she declined. Start Mobic 7.5mg  qd and Tramadol prn. MRI of her lumbar spine ordered. I will call her with report.
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
## 2013-06-02 NOTE — Patient Instructions (Signed)
Back Pain, Adult  Back pain is very common. The pain often gets better over time. The cause of back pain is usually not dangerous. Most people can learn to manage their back pain on their own.   HOME CARE   · Stay active. Start with short walks on flat ground if you can. Try to walk farther each day.  · Do not sit, drive, or stand in one place for more than 30 minutes. Do not stay in bed.  · Do not avoid exercise or work. Activity can help your back heal faster.  · Be careful when you bend or lift an object. Bend at your knees, keep the object close to you, and do not twist.  · Sleep on a firm mattress. Lie on your side, and bend your knees. If you lie on your back, put a pillow under your knees.  · Only take medicines as told by your doctor.  · Put ice on the injured area.  · Put ice in a plastic bag.  · Place a towel between your skin and the bag.  · Leave the ice on for 15-20 minutes, 03-04 times a day for the first 2 to 3 days. After that, you can switch between ice and heat packs.  · Ask your doctor about back exercises or massage.  · Avoid feeling anxious or stressed. Find good ways to deal with stress, such as exercise.  GET HELP RIGHT AWAY IF:   · Your pain does not go away with rest or medicine.  · Your pain does not go away in 1 week.  · You have new problems.  · You do not feel well.  · The pain spreads into your legs.  · You cannot control when you poop (bowel movement) or pee (urinate).  · Your arms or legs feel weak or lose feeling (numbness).  · You feel sick to your stomach (nauseous) or throw up (vomit).  · You have belly (abdominal) pain.  · You feel like you may pass out (faint).  MAKE SURE YOU:   · Understand these instructions.  · Will watch your condition.  · Will get help right away if you are not doing well or get worse.  Document Released: 07/02/2007 Document Revised: 04/07/2011 Document Reviewed: 06/03/2010  [employer]® Patient Information ©2014 [employer].

## 2013-06-03 NOTE — Addendum Note (Signed)
Addended by: Janit PaganENIOLA, Sarabi Sockwell T on: 06/03/2013 11:17 AM   Modules accepted: Orders

## 2013-06-09 ENCOUNTER — Telehealth: Payer: Self-pay | Admitting: *Deleted

## 2013-06-09 NOTE — Telephone Encounter (Signed)
LM for patient to call back.  Need to know if she is claustraphobic for her MRI.  She is currently scheduled for Friday May 22@ 8pm.  [facility]  Pt can call [phone] to reschedule this if needed but this was the first available they had.  Thanks Limited BrandsJazmin Micheale [name],CMA

## 2013-06-16 NOTE — Telephone Encounter (Signed)
LM for patient to call back and get appt information regarding her upcoming scan. Jazmin Hartsell,CMA

## 2013-06-17 ENCOUNTER — Ambulatory Visit (HOSPITAL_COMMUNITY): Admission: RE | Admit: 2013-06-17 | Payer: No Typology Code available for payment source | Source: Ambulatory Visit

## 2013-10-27 ENCOUNTER — Other Ambulatory Visit: Payer: Self-pay | Admitting: Family Medicine

## 2013-10-27 DIAGNOSIS — Z1231 Encounter for screening mammogram for malignant neoplasm of breast: Secondary | ICD-10-CM

## 2014-01-04 ENCOUNTER — Telehealth: Payer: Self-pay | Admitting: *Deleted

## 2014-01-04 NOTE — Telephone Encounter (Signed)
Left voice message for pt to call and reschedule TB skin test appt.  Appt scheduled for 01/05/2014; TB skin tests are not placed on Thursday due to clinic is closed on weekends.  Clovis PuMartin, Khristian Phillippi L, RN

## 2014-01-05 ENCOUNTER — Ambulatory Visit: Payer: Self-pay

## 2014-01-23 ENCOUNTER — Ambulatory Visit (HOSPITAL_COMMUNITY)
Admission: RE | Admit: 2014-01-23 | Discharge: 2014-01-23 | Disposition: A | Discharge status: Home / Self Care | Payer: No Typology Code available for payment source | Source: Ambulatory Visit | Attending: Family Medicine | Admitting: Family Medicine

## 2014-01-23 DIAGNOSIS — Z1231 Encounter for screening mammogram for malignant neoplasm of breast: Secondary | ICD-10-CM

## 2014-01-30 ENCOUNTER — Ambulatory Visit (INDEPENDENT_AMBULATORY_CARE_PROVIDER_SITE_OTHER): Payer: No Typology Code available for payment source | Admitting: *Deleted

## 2014-01-30 DIAGNOSIS — Z111 Encounter for screening for respiratory tuberculosis: Secondary | ICD-10-CM

## 2014-01-30 NOTE — Progress Notes (Signed)
   PPD placed Left Forearm.  Pt to return 02/01/2014 for reading.  Pt tolerated intradermal injection. Clovis Pu, RN

## 2014-02-01 ENCOUNTER — Ambulatory Visit (INDEPENDENT_AMBULATORY_CARE_PROVIDER_SITE_OTHER): Payer: Self-pay | Admitting: *Deleted

## 2014-02-01 ENCOUNTER — Encounter: Payer: Self-pay | Admitting: *Deleted

## 2014-02-01 DIAGNOSIS — Z111 Encounter for screening for respiratory tuberculosis: Secondary | ICD-10-CM

## 2014-02-01 DIAGNOSIS — Z7689 Persons encountering health services in other specified circumstances: Secondary | ICD-10-CM

## 2014-02-01 LAB — TB SKIN TEST
Induration: 0 mm
TB Skin Test: NEGATIVE

## 2014-02-01 NOTE — Progress Notes (Signed)
   PPD Reading Note PPD read and results entered in EpicCare. Result: 0 mm induration. Interpretation: Negative Allergic reaction: no Kalvyn Desa L, RN  

## 2014-10-27 ENCOUNTER — Other Ambulatory Visit: Payer: Self-pay | Admitting: *Deleted

## 2014-10-27 DIAGNOSIS — Z1231 Encounter for screening mammogram for malignant neoplasm of breast: Secondary | ICD-10-CM

## 2014-11-10 ENCOUNTER — Telehealth: Payer: Self-pay | Admitting: Family Medicine

## 2014-11-10 ENCOUNTER — Encounter: Payer: Self-pay | Admitting: Family Medicine

## 2014-11-10 ENCOUNTER — Ambulatory Visit (INDEPENDENT_AMBULATORY_CARE_PROVIDER_SITE_OTHER): Payer: Self-pay | Admitting: Family Medicine

## 2014-11-10 VITALS — BP 102/64 | HR 74 | Temp 98.2°F | Ht 64.0 in | Wt 191.0 lb

## 2014-11-10 DIAGNOSIS — J069 Acute upper respiratory infection, unspecified: Secondary | ICD-10-CM | POA: Diagnosis not present

## 2014-11-10 MED ORDER — GUAIFENESIN-CODEINE 100-10 MG/5ML PO SOLN: 10.0000 mL | Freq: Four times a day (QID) | ORAL | Status: DC | PRN

## 2014-11-10 MED ORDER — IPRATROPIUM BROMIDE 0.03 % NA SOLN
2.0000 | Freq: Two times a day (BID) | NASAL | Status: DC
Start: 2014-11-10 — End: 2017-06-29
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
## 2014-11-10 NOTE — Telephone Encounter (Signed)
Patient needs to be seen.

## 2014-11-10 NOTE — Telephone Encounter (Signed)
Need an antibiotic called in.  Chest hurt when she coughs and sneeze.  Felt this way when she had bronchitis..  [facility] [address].  If prescribed please give generic

## 2014-11-10 NOTE — Patient Instructions (Signed)
The cough medication can make you very sleepy, only take this when you do not need to do anything Use the nasal spray to help with congestion If you develop a change in chest pain, shortness of breath, or new symptoms come back and see us.  Upper Respiratory Infection, Adult Most upper respiratory infections (URIs) are a viral infection of the air passages leading to the lungs. A URI affects the nose, throat, and upper air passages. The most common type of URI is nasopharyngitis and is typically referred to as "the common cold." URIs run their course and usually go away on their own. Most of the time, a URI does not require medical attention, but sometimes a bacterial infection in the upper airways can follow a viral infection. This is called a secondary infection. Sinus and middle ear infections are common types of secondary upper respiratory infections. Bacterial pneumonia can also complicate a URI. A URI can worsen asthma and chronic obstructive pulmonary disease (COPD). Sometimes, these complications can require emergency medical care and may be life threatening.  CAUSES Almost all URIs are caused by viruses. A virus is a type of germ and can spread from one person to another.  RISKS FACTORS You may be at risk for a URI if:   You smoke.   You have chronic heart or lung disease.  You have a weakened defense (immune) system.   You are very young or very old.   You have nasal allergies or asthma.  You work in crowded or poorly ventilated areas.  You work in health care facilities or schools. SIGNS AND SYMPTOMS  Symptoms typically develop 2-3 days after you come in contact with a cold virus. Most viral URIs last 7-10 days. However, viral URIs from the influenza virus (flu virus) can last 14-18 days and are typically more severe. Symptoms may include:   Runny or stuffy (congested) nose.   Sneezing.   Cough.   Sore throat.   Headache.   Fatigue.   Fever.   Loss of  appetite.   Pain in your forehead, behind your eyes, and over your cheekbones (sinus pain).  Muscle aches.  DIAGNOSIS  Your health care provider may diagnose a URI by:  Physical exam.  Tests to check that your symptoms are not due to another condition such as:  Strep throat.  Sinusitis.  Pneumonia.  Asthma. TREATMENT  A URI goes away on its own with time. It cannot be cured with medicines, but medicines may be prescribed or recommended to relieve symptoms. Medicines may help:  Reduce your fever.  Reduce your cough.  Relieve nasal congestion. HOME CARE INSTRUCTIONS   Take medicines only as directed by your health care provider.   Gargle warm saltwater or take cough drops to comfort your throat as directed by your health care provider.  Use a warm mist humidifier or inhale steam from a shower to increase air moisture. This may make it easier to breathe.  Drink enough fluid to keep your urine clear or pale yellow.   Eat soups and other clear broths and maintain good nutrition.   Rest as needed.   Return to work when your temperature has returned to normal or as your health care provider advises. You may need to stay home longer to avoid infecting others. You can also use a face mask and careful hand washing to prevent spread of the virus.  Increase the usage of your inhaler if you have asthma.   Do not use any  tobacco products, including cigarettes, chewing tobacco, or electronic cigarettes. If you need help quitting, ask your health care provider. PREVENTION  The best way to protect yourself from getting a cold is to practice good hygiene.   Avoid oral or hand contact with people with cold symptoms.   Wash your hands often if contact occurs.  There is no clear evidence that vitamin C, vitamin E, echinacea, or exercise reduces the chance of developing a cold. However, it is always recommended to get plenty of rest, exercise, and practice good nutrition.    SEEK MEDICAL CARE IF:   You are getting worse rather than better.   Your symptoms are not controlled by medicine.   You have chills.  You have worsening shortness of breath.  You have brown or red mucus.  You have yellow or brown nasal discharge.  You have pain in your face, especially when you bend forward.  You have a fever.  You have swollen neck glands.  You have pain while swallowing.  You have white areas in the back of your throat. SEEK IMMEDIATE MEDICAL CARE IF:   You have severe or persistent:  Headache.  Ear pain.  Sinus pain.  Chest pain.  You have chronic lung disease and any of the following:  Wheezing.  Prolonged cough.  Coughing up blood.  A change in your usual mucus.  You have a stiff neck.  You have changes in your:  Vision.  Hearing.  Thinking.  Mood. MAKE SURE YOU:   Understand these instructions.  Will watch your condition.  Will get help right away if you are not doing well or get worse.   This information is not intended to replace advice given to you by your health care provider. Make sure you discuss any questions you have with your health care provider.   Document Released: 07/09/2000 Document Revised: 05/30/2014 Document Reviewed: 04/20/2013 Elsevier Interactive Patient Education [employer].

## 2014-11-10 NOTE — Telephone Encounter (Signed)
Attempted to call x 2.  Never Rang, just silence.  Will await callback. Jaquetta Currier, Maryjo RochesterJessica Dawn

## 2014-11-11 ENCOUNTER — Encounter: Payer: Self-pay | Admitting: Family Medicine

## 2014-11-11 NOTE — Assessment & Plan Note (Signed)
Patient presenting with symptoms consistent with a URI, most likely viral in nature. Not in respiratory distress with clear lungs on exam. Discussed symptomatic treatment.  Patient's most bothersome symptoms are cough, lack of sleep due to cough, chest congestion, and nasal congestion. -Instructed her to continue to stay well hydrated - Rx for guaifensein-codeine syrup PRN cough and to loosen phlegm (advised about drowsiness) - Ipratropium nasal spray BID for nasal congestion  - Tylenol or Ibuprofen during the day for pain   - Letter written to excuse from work on 10/14 (2nd shift) and to return on 10/17. - RTC precautions: SOB, fevers, worsening symptoms, chest pain, inability to take PO, lack of improvement after 7 days.

## 2014-12-05 ENCOUNTER — Telehealth: Payer: Self-pay | Admitting: Family Medicine

## 2014-12-05 NOTE — Telephone Encounter (Signed)
Pt declined to come in for an appt. She said she will just try something over the counter. Deseree Bruna PotterBlount, CMA

## 2014-12-05 NOTE — Telephone Encounter (Signed)
Will forward to Dr. Leonides Schanzorsey and see if patient is able to get an antibiotic.  Dani Danis,CMA

## 2014-12-05 NOTE — Telephone Encounter (Signed)
I'm sorry but I will not be able to call in an antibiotic without someone evaluating the patient. There are numerous types of infections that require different antibiotics and many infections are viral in etiology and do not require antibiotics, we would need to see her to determine.  Thanks, Joanna Puffrystal S. Taj Arteaga, MD [facility] Resident  12/05/2014, 11:09 AM

## 2014-12-05 NOTE — Telephone Encounter (Signed)
Pt called and would like some antibiotics called in. She is still sick and can not afford to come in here. She has to be at work at 3 pm today. She said to call and let her know. jw

## 2015-01-26 ENCOUNTER — Ambulatory Visit
Admission: RE | Admit: 2015-01-26 | Discharge: 2015-01-26 | Disposition: A | Discharge status: Home / Self Care | Payer: No Typology Code available for payment source | Source: Ambulatory Visit | Attending: Family Medicine | Admitting: Family Medicine

## 2015-01-26 DIAGNOSIS — Z1231 Encounter for screening mammogram for malignant neoplasm of breast: Secondary | ICD-10-CM

## 2015-02-05 ENCOUNTER — Encounter: Payer: BLUE CROSS/BLUE SHIELD | Admitting: Family Medicine

## 2015-12-24 ENCOUNTER — Other Ambulatory Visit: Payer: Self-pay | Admitting: Family Medicine

## 2015-12-25 ENCOUNTER — Telehealth (HOSPITAL_COMMUNITY): Payer: Self-pay | Admitting: *Deleted

## 2015-12-25 NOTE — Telephone Encounter (Signed)
Attempted to call patient to schedule appointment with [facility]. No one answered phone. Left voicemail for patient to call back. 

## 2016-01-09 ENCOUNTER — Other Ambulatory Visit: Payer: Self-pay | Admitting: Infectious Disease

## 2016-01-09 DIAGNOSIS — Z1231 Encounter for screening mammogram for malignant neoplasm of breast: Secondary | ICD-10-CM

## 2016-02-29 ENCOUNTER — Ambulatory Visit
Admission: RE | Admit: 2016-02-29 | Discharge: 2016-02-29 | Disposition: A | Discharge status: Home / Self Care | Payer: Self-pay | Source: Ambulatory Visit | Attending: Infectious Disease | Admitting: Infectious Disease

## 2016-02-29 DIAGNOSIS — Z1231 Encounter for screening mammogram for malignant neoplasm of breast: Secondary | ICD-10-CM

## 2016-08-12 ENCOUNTER — Ambulatory Visit (INDEPENDENT_AMBULATORY_CARE_PROVIDER_SITE_OTHER): Payer: BLUE CROSS/BLUE SHIELD | Admitting: Family Medicine

## 2016-08-12 ENCOUNTER — Other Ambulatory Visit (HOSPITAL_COMMUNITY)
Admission: RE | Admit: 2016-08-12 | Discharge: 2016-08-12 | Disposition: A | Discharge status: Home / Self Care | Payer: BLUE CROSS/BLUE SHIELD | Source: Ambulatory Visit | Attending: Family Medicine | Admitting: Family Medicine

## 2016-08-12 ENCOUNTER — Encounter: Payer: Self-pay | Admitting: Family Medicine

## 2016-08-12 VITALS — BP 118/78 | HR 70 | Temp 98.5°F | Ht 64.0 in | Wt 194.0 lb

## 2016-08-12 DIAGNOSIS — Z0000 Encounter for general adult medical examination without abnormal findings: Secondary | ICD-10-CM | POA: Insufficient documentation

## 2016-08-12 DIAGNOSIS — M545 Low back pain, unspecified: Secondary | ICD-10-CM

## 2016-08-12 DIAGNOSIS — Z1159 Encounter for screening for other viral diseases: Secondary | ICD-10-CM | POA: Diagnosis present

## 2016-08-12 DIAGNOSIS — Z124 Encounter for screening for malignant neoplasm of cervix: Secondary | ICD-10-CM | POA: Insufficient documentation

## 2016-08-12 DIAGNOSIS — G8929 Other chronic pain: Secondary | ICD-10-CM | POA: Insufficient documentation

## 2016-08-12 DIAGNOSIS — Z113 Encounter for screening for infections with a predominantly sexual mode of transmission: Secondary | ICD-10-CM

## 2016-08-12 NOTE — Patient Instructions (Addendum)
It was a pleasure to see you today! Thank you for choosing [facility] for your primary care. Lorayne MarekSharon D Gancarz was seen for annual wellness visit, pap smear, and STI testing. We will contact you with you lab results. Please come back in 1 month or at your convenience to discuss any lab results and discuss your lower back pain.   Best,  Thomes DinningBrad Thompson, MD, MS FAMILY MEDICINE RESIDENT - PGY1 08/12/2016 10:05 AM
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
## 2016-08-12 NOTE — Assessment & Plan Note (Signed)
Pap

## 2016-08-12 NOTE — Assessment & Plan Note (Signed)
routnine screening for age group. Low risk. No s/s of liver disease/Hep C.

## 2016-08-12 NOTE — Assessment & Plan Note (Signed)
Here to establish care. PE was normal. Also get CMP for baseline LFT, Renal Fxn.
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
## 2016-08-13 ENCOUNTER — Encounter: Payer: Self-pay | Admitting: Family Medicine

## 2016-08-13 ENCOUNTER — Telehealth: Payer: Self-pay | Admitting: Family Medicine

## 2016-08-13 ENCOUNTER — Other Ambulatory Visit: Payer: Self-pay | Admitting: Family Medicine

## 2016-08-13 LAB — CERVICOVAGINAL ANCILLARY ONLY
Chlamydia: NEGATIVE
Neisseria Gonorrhea: NEGATIVE

## 2016-08-13 NOTE — Telephone Encounter (Signed)
LM for patient to call back. [name],CMA  

## 2016-08-13 NOTE — Telephone Encounter (Signed)
Pt informed. She will come by Monday and get letter. Sunday SpillersSharon T Praise [name], CMA

## 2016-08-13 NOTE — Telephone Encounter (Signed)
-----   Message from Garnette GunnerAaron B Thompson, MD sent at 08/13/2016 12:24 PM EDT ----- Results were negative for G/C. Called pt, but no answer. Left a message to call the clinic for results. Could you please try to reach out to pt again this later? Best Regards, Nida BoatmanBrad

## 2016-08-13 NOTE — Telephone Encounter (Signed)
Will forward to MD to see if he called her this morning, labs don't seem to be back yet.  Jazmin Hartsell,CMA

## 2016-08-13 NOTE — Telephone Encounter (Signed)
Pt states someone from the clinic called her this morning, no previous notes. Pt would like someone to call her with lab results. ep

## 2016-08-14 ENCOUNTER — Telehealth: Payer: Self-pay

## 2016-08-14 LAB — CMP AND LIVER
ALT: 17 IU/L (ref 0–32)
AST: 19 IU/L (ref 0–40)
Albumin: 4.2 g/dL (ref 3.5–5.5)
Alkaline Phosphatase: 84 IU/L (ref 39–117)
BILIRUBIN TOTAL: 0.8 mg/dL (ref 0.0–1.2)
BILIRUBIN, DIRECT: 0.19 mg/dL (ref 0.00–0.40)
BUN: 9 mg/dL (ref 6–24)
CALCIUM: 10.1 mg/dL (ref 8.7–10.2)
CO2: 24 mmol/L (ref 20–29)
Chloride: 100 mmol/L (ref 96–106)
Creatinine, Ser: 0.62 mg/dL (ref 0.57–1.00)
GFR, EST AFRICAN AMERICAN: 119 mL/min/{1.73_m2} (ref >59)
GFR, EST NON AFRICAN AMERICAN: 103 mL/min/{1.73_m2} (ref >59)
GLUCOSE: 97 mg/dL (ref 65–99)
Potassium: 4.9 mmol/L (ref 3.5–5.2)
SODIUM: 140 mmol/L (ref 134–144)
TOTAL PROTEIN: 7.2 g/dL (ref 6.0–8.5)

## 2016-08-14 LAB — LIPID PANEL
CHOLESTEROL TOTAL: 159 mg/dL (ref 100–199)
Chol/HDL Ratio: 2.8 ratio (ref 0.0–4.4)
HDL: 56 mg/dL (ref >39)
LDL Calculated: 85 mg/dL (ref 0–99)
Triglycerides: 90 mg/dL (ref 0–149)
VLDL Cholesterol Cal: 18 mg/dL (ref 5–40)

## 2016-08-14 LAB — HEPATITIS C ANTIBODY: Hep C Virus Ab: <0.1 s/co ratio (ref 0.0–0.9)

## 2016-08-14 LAB — CYTOLOGY - PAP
Adequacy: ABSENT
DIAGNOSIS: NEGATIVE
HPV (WINDOPATH): NOT DETECTED

## 2016-08-14 LAB — RPR: RPR Ser Ql: NONREACTIVE

## 2016-08-14 LAB — HIV ANTIBODY (ROUTINE TESTING W REFLEX): HIV SCREEN 4TH GENERATION: NONREACTIVE

## 2016-08-14 NOTE — Telephone Encounter (Signed)
Pt contacted and informed of negative pap results. Pt was very appreciative of the call and had no further concerns.

## 2016-08-14 NOTE — Progress Notes (Signed)
Can you please call pt with pap results? It was negative.

## 2016-08-14 NOTE — Telephone Encounter (Signed)
-----   Message from Garnette GunnerAaron B Thompson, MD sent at 08/14/2016  9:52 AM EDT ----- Can you please call pt with pap results? It was negative.

## 2016-08-14 NOTE — Progress Notes (Signed)
When you call pt, can you let her know that her blood chemistries and cholesterol panels were normal as well.

## 2016-08-14 NOTE — Progress Notes (Signed)
And let her HIV/RPR was normal too!

## 2016-09-08 ENCOUNTER — Telehealth: Payer: Self-pay | Admitting: Family Medicine

## 2016-09-08 NOTE — Telephone Encounter (Signed)
Patient would like a copy of her HIV test. Please call her when it is ready to be picked up.

## 2016-09-08 NOTE — Telephone Encounter (Signed)
The test has been printed and placed up front for pick up. I called pt to inform of this, she did not answer.

## 2016-12-30 ENCOUNTER — Other Ambulatory Visit: Payer: Self-pay | Admitting: Family Medicine

## 2017-02-19 ENCOUNTER — Other Ambulatory Visit: Payer: Self-pay | Admitting: Family Medicine

## 2017-03-02 ENCOUNTER — Other Ambulatory Visit: Payer: Self-pay | Admitting: Family Medicine

## 2017-03-05 ENCOUNTER — Other Ambulatory Visit: Payer: Self-pay | Admitting: Family Medicine

## 2017-03-05 DIAGNOSIS — Z1231 Encounter for screening mammogram for malignant neoplasm of breast: Secondary | ICD-10-CM

## 2017-03-24 ENCOUNTER — Encounter: Payer: Self-pay | Admitting: Family Medicine

## 2017-03-24 ENCOUNTER — Ambulatory Visit: Payer: Self-pay

## 2017-03-24 ENCOUNTER — Telehealth: Payer: Self-pay

## 2017-03-24 NOTE — Telephone Encounter (Signed)
Patient left message on nurse line that she would like a bone density test performed when she has her mammogram. Order needs to be entered. Ples SpecterAlisa Brake, RN [facility] RN)

## 2017-03-24 NOTE — Telephone Encounter (Signed)
LM for patient asking her to call back.  Jazmin Hartsell,CMA  

## 2017-04-02 NOTE — Telephone Encounter (Signed)
Pt returning Jazmin's phone call and asked to speak to her. Told pt Jazmin was with another pt at the moment, so pt asked me about the referral to get a bone density test done. She is requesting a referral to get it done and I read off Pratt's message. Pt said she is wanting the test because she moves around a lot at work and when she moves/bends down/etc., she hears her bones cracking a lot. I told pt our doctors usually want to see the pt for the problem before they put in a referral. Pt understoood that but still want's to discuss this over the phone with Shawnie PonsPratt before coming in for an appointment. Please give pt a call at [phone]

## 2017-04-02 NOTE — Telephone Encounter (Signed)
Will forward to MD. Mackson Botz,CMA  

## 2017-04-13 ENCOUNTER — Ambulatory Visit
Admission: RE | Admit: 2017-04-13 | Discharge: 2017-04-13 | Disposition: A | Discharge status: Home / Self Care | Payer: BLUE CROSS/BLUE SHIELD | Source: Ambulatory Visit | Attending: Family Medicine | Admitting: Family Medicine

## 2017-04-13 DIAGNOSIS — Z1231 Encounter for screening mammogram for malignant neoplasm of breast: Secondary | ICD-10-CM

## 2017-06-29 ENCOUNTER — Ambulatory Visit (INDEPENDENT_AMBULATORY_CARE_PROVIDER_SITE_OTHER): Payer: BLUE CROSS/BLUE SHIELD | Admitting: Internal Medicine

## 2017-06-29 ENCOUNTER — Encounter: Payer: Self-pay | Admitting: Internal Medicine

## 2017-06-29 ENCOUNTER — Other Ambulatory Visit: Payer: Self-pay

## 2017-06-29 VITALS — BP 122/68 | HR 84 | Temp 98.9°F | Ht 64.0 in | Wt 193.2 lb

## 2017-06-29 DIAGNOSIS — J309 Allergic rhinitis, unspecified: Secondary | ICD-10-CM | POA: Diagnosis not present

## 2017-06-29 DIAGNOSIS — J069 Acute upper respiratory infection, unspecified: Secondary | ICD-10-CM | POA: Diagnosis not present

## 2017-06-29 MED ORDER — FLUTICASONE PROPIONATE 50 MCG/ACT NA SUSP: 2.0000 | Freq: Every day | NASAL | 6 refills | Status: DC

## 2017-06-29 MED ORDER — ALBUTEROL SULFATE HFA 108 (90 BASE) MCG/ACT IN AERS: 1.0000 | INHALATION_SPRAY | RESPIRATORY_TRACT | 0 refills | Status: DC | PRN

## 2017-06-29 MED ORDER — GUAIFENESIN-CODEINE 100-10 MG/5ML PO SOLN: 10.0000 mL | Freq: Four times a day (QID) | ORAL | 0 refills | Status: DC | PRN

## 2017-06-29 NOTE — Progress Notes (Signed)
   [facility] Noralee CharsAsiyah Madeline Pho, MD Phone: [phone]  Reason For Visit: SDA for URI  # URI  Has been sick for 5 days. Patient went of [facility] and received medications for symptomatic treatment including atrovent and Tessalon perles Patient has been still having congestion. Patient feels like the medication did not help. Symptoms  Fever: None  Headache or face pain: None  Sneezing: None Shortness of breath: none Sore throat or swollen glands: None   ROS see HPI Smoking Status noted  Objective: BP 122/68   Pulse 84   Temp 98.9 F (37.2 C) (Oral)   Ht 5\' 4"  (1.626 m)   Wt 193 lb 3.2 oz (87.6 kg)   SpO2 95%   BMI 33.16 kg/m  Gen: NAD, alert, cooperative with exam HEENT: Normal    Neck: No masses palpated. No lymphadenopathy    Nose: nasal turbinates congested     Throat: moist mucus membranes, no erythema Cardio: regular rate and rhythm, S1S2 heard, no murmurs appreciated Pulm: clear to auscultation bilaterally, expiratory wheezing/rhonchi noted on exam  Skin: dry, intact, no rashes or lesions   Assessment/Plan: See problem based a/p  Acute upper respiratory infection Viral URI, given patient had wheezing and rhonchi discussed possible chest x-ray to rule out pneumonia.  However patient without fevers or shortness of breath overall feels mostly congested.  therefore most likely viral. -Patient currently would like to hold off of getting a chest x-ray - if no improvement in 2-3 days will follow up  - fluticasone (FLONASE) 50 MCG/ACT nasal spray; Place 2 sprays into both nostrils daily.  Dispense: 16 g; Refill: 6 - guaiFENesin-codeine 100-10 MG/5ML syrup; Take 10 mLs by mouth every 6 (six) hours as needed for cough.  Dispense: 120 mL; Refill: 0 - Provided albuterol inhaler for wheezing noted.

## 2017-06-29 NOTE — Assessment & Plan Note (Signed)
Viral URI, given patient had wheezing and rhonchi discussed possible chest x-ray to rule out pneumonia.  However patient without fevers or shortness of breath overall feels mostly congested.  therefore most likely viral. -Patient currently would like to hold off of getting a chest x-ray - if no improvement in 2-3 days will follow up  - fluticasone (FLONASE) 50 MCG/ACT nasal spray; Place 2 sprays into both nostrils daily.  Dispense: 16 g; Refill: 6 - guaiFENesin-codeine 100-10 MG/5ML syrup; Take 10 mLs by mouth every 6 (six) hours as needed for cough.  Dispense: 120 mL; Refill: 0 - Provided albuterol inhaler for wheezing noted.

## 2017-06-29 NOTE — Patient Instructions (Addendum)
I want you to start using flonase, the cough medication, and you can the inhaler to help as well

## 2017-08-19 ENCOUNTER — Telehealth: Payer: Self-pay | Admitting: *Deleted

## 2017-08-19 DIAGNOSIS — M21619 Bunion of unspecified foot: Secondary | ICD-10-CM

## 2017-08-19 NOTE — Telephone Encounter (Signed)
Pt is requesting a referral to [facility].  She has "flat feet" and her feet are always hurting her since she "is on my feet at work all the time".  She is also complaining of bunions. Fleeger, Maryjo RochesterJessica Dawn, CMA

## 2017-12-16 ENCOUNTER — Telehealth: Payer: Self-pay | Admitting: Family Medicine

## 2017-12-16 DIAGNOSIS — R269 Unspecified abnormalities of gait and mobility: Secondary | ICD-10-CM

## 2017-12-16 NOTE — Telephone Encounter (Signed)
Pt called and needs a new referral for the [facility] . She has new Patent attorneyinsurance jw

## 2017-12-21 NOTE — Telephone Encounter (Signed)
New referral placed.  Obera Stauch,CMA

## 2018-01-18 ENCOUNTER — Other Ambulatory Visit: Payer: Self-pay | Admitting: Podiatry

## 2018-01-18 ENCOUNTER — Ambulatory Visit (INDEPENDENT_AMBULATORY_CARE_PROVIDER_SITE_OTHER): Payer: 59 | Admitting: Podiatry

## 2018-01-18 ENCOUNTER — Encounter: Payer: Self-pay | Admitting: Podiatry

## 2018-01-18 ENCOUNTER — Ambulatory Visit (INDEPENDENT_AMBULATORY_CARE_PROVIDER_SITE_OTHER): Payer: 59

## 2018-01-18 ENCOUNTER — Encounter

## 2018-01-18 DIAGNOSIS — M7751 Other enthesopathy of right foot: Secondary | ICD-10-CM

## 2018-01-18 DIAGNOSIS — M779 Enthesopathy, unspecified: Secondary | ICD-10-CM

## 2018-01-18 DIAGNOSIS — M7752 Other enthesopathy of left foot: Secondary | ICD-10-CM

## 2018-01-18 MED ORDER — MELOXICAM 15 MG PO TABS: 15.0000 mg | ORAL_TABLET | Freq: Every day | ORAL | 3 refills | Status: DC

## 2018-01-18 MED ORDER — METHYLPREDNISOLONE 4 MG PO TABS: ORAL_TABLET | ORAL | 0 refills | Status: DC

## 2018-01-18 NOTE — Progress Notes (Signed)
  Subjective:  Patient ID: [name], female    DOB: 1960/09/10,  MRN: [id number] HPI Chief Complaint  Patient presents with  . Foot Pain    Patient presents today for bilat ankle pain x 4 months    57 y.o. female presents with the above complaint.   ROS: Denies fever chills nausea vomiting muscle aches pains calf pain back pain chest pain shortness of breath.  Past Medical History:  Diagnosis Date  . Environmental allergies    Past Surgical History:  Procedure Laterality Date  . CERVICAL FUSION  2012    Current Outpatient Medications:  .  ibuprofen (ADVIL,MOTRIN) 200 MG tablet, Take 200 mg by mouth every 6 (six) hours as needed., Disp: , Rfl:  .  meloxicam (MOBIC) 15 MG tablet, Take 1 tablet (15 mg total) by mouth daily., Disp: 30 tablet, Rfl: 3 .  methylPREDNISolone (MEDROL) 4 MG tablet, Take as directed, Disp: 21 tablet, Rfl: 0  No Known Allergies Review of Systems Objective:  There were no vitals filed for this visit.  General: Well developed, nourished, in no acute distress, alert and oriented x3   Dermatological: Skin is warm, dry and supple bilateral. Nails x 10 are well maintained; remaining integument appears unremarkable at this time. There are no open sores, no preulcerative lesions, no rash or signs of infection present.  Vascular: Dorsalis Pedis artery and Posterior Tibial artery pedal pulses are 2/4 bilateral with immedate capillary fill time. Pedal hair growth present. No varicosities and no lower extremity edema present bilateral.   Neruologic: Grossly intact via light touch bilateral. Vibratory intact via tuning fork bilateral. Protective threshold with Semmes Wienstein monofilament intact to all pedal sites bilateral. Patellar and Achilles deep tendon reflexes 2+ bilateral. No Babinski or clonus noted bilateral.   Musculoskeletal: No gross boney pedal deformities bilateral. No pain, crepitus, or limitation noted with foot and ankle range of motion  bilateral. Muscular strength 5/5 in all groups tested bilateral.  Flexible pes planus bilateral she is fairly rigid on the right secondary to her inability to let me move the foot due to her nervousness over pain.  Severe pes planus more than likely result of posterior tibial tendon dysfunction and tendinitis.  Gait: Unassisted, Nonantalgic.    Radiographs:  Radiographs taken today demonstrate severe pes planus bilaterally with osteoarthritic changes of the midtarsal joint.  She also has changes and soft tissue swelling around the navicular tuberosity and the medial aspect of the right foot.  Assessment & Plan:   Assessment: Posterior tibial tendon dysfunction pes planus right over left with posterior tibial tendinitis.  Cannot rule out a posterior tibial tendon tear.  Plan: Discussed etiology pathology conservative surgical therapy at this point I put her on Sterapred Dosepak put her in a cam walker and I injected her with 20 mg Kenalog 5 mg Marcaine point maximal tenderness.  If not improved in 1 month will consider MRI.     Wei Poplaski T. GreeneversHyatt, [place] DakotaDPM

## 2018-02-15 ENCOUNTER — Ambulatory Visit: Payer: 59 | Admitting: Podiatry

## 2018-02-17 ENCOUNTER — Encounter: Payer: Self-pay | Admitting: Podiatry

## 2018-02-17 ENCOUNTER — Ambulatory Visit (INDEPENDENT_AMBULATORY_CARE_PROVIDER_SITE_OTHER): Payer: 59 | Admitting: Podiatry

## 2018-02-17 DIAGNOSIS — M2141 Flat foot [pes planus] (acquired), right foot: Secondary | ICD-10-CM

## 2018-02-17 DIAGNOSIS — M2142 Flat foot [pes planus] (acquired), left foot: Secondary | ICD-10-CM | POA: Diagnosis not present

## 2018-02-17 NOTE — Progress Notes (Signed)
She presents today for follow-up of her posterior tibial tendinitis and pes planus states that is better but I think I am going to have to get the orthotics anyway.  Objective: Vital signs are stable alert oriented x3 no reproducible pain on palpation of the right foot but pes planus is noted right.  Assessment: Pes planus posterior tibial tendon dysfunction right foot.  Plan: 1 request orthotics otherwise she is to continue all conservative therapies until those are made.

## 2018-02-25 ENCOUNTER — Telehealth: Payer: Self-pay | Admitting: Podiatry

## 2018-02-25 NOTE — Telephone Encounter (Signed)
Pt called requesting refill on medication because she lost the bottle. Please give pt a call.

## 2018-03-01 NOTE — Telephone Encounter (Signed)
Returned call to patient and left voice mail to call back to verify medication and pharmacy.

## 2018-03-03 ENCOUNTER — Ambulatory Visit (INDEPENDENT_AMBULATORY_CARE_PROVIDER_SITE_OTHER): Payer: 59 | Admitting: Orthotics

## 2018-03-03 DIAGNOSIS — M2141 Flat foot [pes planus] (acquired), right foot: Secondary | ICD-10-CM

## 2018-03-03 DIAGNOSIS — M7752 Other enthesopathy of left foot: Secondary | ICD-10-CM

## 2018-03-03 DIAGNOSIS — M7751 Other enthesopathy of right foot: Secondary | ICD-10-CM

## 2018-03-03 DIAGNOSIS — M2142 Flat foot [pes planus] (acquired), left foot: Secondary | ICD-10-CM

## 2018-03-03 DIAGNOSIS — M779 Enthesopathy, unspecified: Secondary | ICD-10-CM

## 2018-03-03 NOTE — Progress Notes (Signed)
Patient presents today with a hx of PTTD/AAF.  Upon assessment, patient has pronounced pes planus w/ a valgus RF deformity.  Patient has medially shifted talus/navicular.  Goal is provide longitudinal arch support and RF stability.  Plan on deep heel cup, hug arch, wide foot orthosis w/ medial flange and varus correction for RF valgus deformity.  Patient educated in the progessive nature of PTTD and financial responsibility.  

## 2018-03-08 NOTE — Telephone Encounter (Signed)
Follow up call, left another voice mail to call back with medication name and pharmacy.

## 2018-03-16 ENCOUNTER — Other Ambulatory Visit: Payer: Self-pay | Admitting: Family Medicine

## 2018-03-16 DIAGNOSIS — Z1231 Encounter for screening mammogram for malignant neoplasm of breast: Secondary | ICD-10-CM

## 2018-04-07 ENCOUNTER — Other Ambulatory Visit: Payer: 59 | Admitting: Orthotics

## 2018-04-21 ENCOUNTER — Ambulatory Visit: Payer: BLUE CROSS/BLUE SHIELD

## 2018-05-24 ENCOUNTER — Other Ambulatory Visit: Payer: Self-pay

## 2018-05-24 MED ORDER — MELOXICAM 15 MG PO TABS: 15.0000 mg | ORAL_TABLET | Freq: Every day | ORAL | 3 refills | Status: DC

## 2018-05-24 NOTE — Telephone Encounter (Signed)
Pharmacy refill request for Meloxicam 15mg.  Per Dr. Hyatt's verbal order, ok to refill.  Script has been sent to pharmacy 

## 2018-07-12 ENCOUNTER — Encounter: Payer: BLUE CROSS/BLUE SHIELD | Admitting: Family Medicine

## 2018-07-14 ENCOUNTER — Ambulatory Visit
Admission: RE | Admit: 2018-07-14 | Discharge: 2018-07-14 | Disposition: A | Discharge status: Home / Self Care | Payer: 59 | Source: Ambulatory Visit | Attending: Family Medicine | Admitting: Family Medicine

## 2018-07-14 ENCOUNTER — Other Ambulatory Visit: Payer: Self-pay

## 2018-07-14 DIAGNOSIS — Z1231 Encounter for screening mammogram for malignant neoplasm of breast: Secondary | ICD-10-CM

## 2018-07-15 ENCOUNTER — Other Ambulatory Visit: Payer: Self-pay

## 2018-07-15 ENCOUNTER — Encounter: Payer: BLUE CROSS/BLUE SHIELD | Admitting: Family Medicine

## 2018-07-15 ENCOUNTER — Encounter: Payer: Self-pay | Admitting: Family Medicine

## 2018-07-15 ENCOUNTER — Ambulatory Visit (INDEPENDENT_AMBULATORY_CARE_PROVIDER_SITE_OTHER): Payer: 59 | Admitting: Family Medicine

## 2018-07-15 VITALS — BP 120/74 | HR 66 | Ht 64.0 in | Wt 188.2 lb

## 2018-07-15 DIAGNOSIS — Z0000 Encounter for general adult medical examination without abnormal findings: Secondary | ICD-10-CM

## 2018-07-15 LAB — POCT GLYCOSYLATED HEMOGLOBIN (HGB A1C): HbA1c, POC (controlled diabetic range): 5.5 % (ref 0.0–7.0)

## 2018-07-15 NOTE — Patient Instructions (Signed)
Preventive Care 40-64 Years, Female Preventive care refers to lifestyle choices and visits with your health care provider that can promote health and wellness. What does preventive care include?   A yearly physical exam. This is also called an annual well check.  Dental exams once or twice a year.  Routine eye exams. Ask your health care provider how often you should have your eyes checked.  Personal lifestyle choices, including: ? Daily care of your teeth and gums. ? Regular physical activity. ? Eating a healthy diet. ? Avoiding tobacco and drug use. ? Limiting alcohol use. ? Practicing safe sex. ? Taking low-dose aspirin daily starting at age 50. ? Taking vitamin and mineral supplements as recommended by your health care provider. What happens during an annual well check? The services and screenings done by your health care provider during your annual well check will depend on your age, overall health, lifestyle risk factors, and family history of disease. Counseling Your health care provider may ask you questions about your:  Alcohol use.  Tobacco use.  Drug use.  Emotional well-being.  Home and relationship well-being.  Sexual activity.  Eating habits.  Work and work environment.  Method of birth control.  Menstrual cycle.  Pregnancy history. Screening You may have the following tests or measurements:  Height, weight, and BMI.  Blood pressure.  Lipid and cholesterol levels. These may be checked every 5 years, or more frequently if you are over 50 years old.  Skin check.  Lung cancer screening. You may have this screening every year starting at age 55 if you have a 30-pack-year history of smoking and currently smoke or have quit within the past 15 years.  Colorectal cancer screening. All adults should have this screening starting at age 50 and continuing until age 75. Your health care provider may recommend screening at age 45. You will have tests every  1-10 years, depending on your results and the type of screening test. People at increased risk should start screening at an earlier age. Screening tests may include: ? Guaiac-based fecal occult blood testing. ? Fecal immunochemical test (FIT). ? Stool DNA test. ? Virtual colonoscopy. ? Sigmoidoscopy. During this test, a flexible tube with a tiny camera (sigmoidoscope) is used to examine your rectum and lower colon. The sigmoidoscope is inserted through your anus into your rectum and lower colon. ? Colonoscopy. During this test, a long, thin, flexible tube with a tiny camera (colonoscope) is used to examine your entire colon and rectum.  Hepatitis C blood test.  Hepatitis B blood test.  Sexually transmitted disease (STD) testing.  Diabetes screening. This is done by checking your blood sugar (glucose) after you have not eaten for a while (fasting). You may have this done every 1-3 years.  Mammogram. This may be done every 1-2 years. Talk to your health care provider about when you should start having regular mammograms. This may depend on whether you have a family history of breast cancer.  BRCA-related cancer screening. This may be done if you have a family history of breast, ovarian, tubal, or peritoneal cancers.  Pelvic exam and Pap test. This may be done every 3 years starting at age 21. Starting at age 30, this may be done every 5 years if you have a Pap test in combination with an HPV test.  Bone density scan. This is done to screen for osteoporosis. You may have this scan if you are at high risk for osteoporosis. Discuss your test results, treatment options,   and if necessary, the need for more tests with your health care provider. Vaccines Your health care provider may recommend certain vaccines, such as:  Influenza vaccine. This is recommended every year.  Tetanus, diphtheria, and acellular pertussis (Tdap, Td) vaccine. You may need a Td booster every 10 years.  Varicella  vaccine. You may need this if you have not been vaccinated.  Zoster vaccine. You may need this after age 38.  Measles, mumps, and rubella (MMR) vaccine. You may need at least one dose of MMR if you were born in 1957 or later. You may also need a second dose.  Pneumococcal 13-valent conjugate (PCV13) vaccine. You may need this if you have certain conditions and were not previously vaccinated.  Pneumococcal polysaccharide (PPSV23) vaccine. You may need one or two doses if you smoke cigarettes or if you have certain conditions.  Meningococcal vaccine. You may need this if you have certain conditions.  Hepatitis A vaccine. You may need this if you have certain conditions or if you travel or work in places where you may be exposed to hepatitis A.  Hepatitis B vaccine. You may need this if you have certain conditions or if you travel or work in places where you may be exposed to hepatitis B.  Haemophilus influenzae type b (Hib) vaccine. You may need this if you have certain conditions. Talk to your health care provider about which screenings and vaccines you need and how often you need them. This information is not intended to replace advice given to you by your health care provider. Make sure you discuss any questions you have with your health care provider. Document Released: 02/09/2015 Document Revised: 03/05/2017 Document Reviewed: 11/14/2014 Elsevier Interactive Patient Education  2019 [employer].

## 2018-07-15 NOTE — Progress Notes (Signed)
SUBJECTIVE:  58 y.o. female for annual routine Pap and checkup.Works as a [employer]ar[employer] manager. Does not smoke, occasionally drinks. No formal exercise regimen but active at work. Does eat junk food at times but in general  Is careful about her diet. No currently sexually active Current Outpatient Medications  Medication Sig Dispense Refill  . ibuprofen (ADVIL,MOTRIN) 200 MG tablet Take 200 mg by mouth every 6 (six) hours as needed.    . meloxicam (MOBIC) 15 MG tablet Take 1 tablet (15 mg total) by mouth daily. 30 tablet 3   No current facility-administered medications for this visit.    Allergies: Patient has no known allergies.  No LMP recorded. (Menstrual status: Perimenopausal).  ROS:  Feeling well. No dyspnea or chest pain on exertion.  No abdominal pain, change in bowel habits, black or bloody stools.  No urinary tract symptoms. GYN ROS: normal menses, no abnormal bleeding, pelvic pain or discharge, no breast pain or new or enlarging lumps on self exam. No neurological complaints.  OBJECTIVE:  The patient appears well, alert, oriented x 3, in no distress. BP 120/74   Pulse 66   Ht 5\' 4"  (1.626 m)   Wt 188 lb 4 oz (85.4 kg)   SpO2 99%   BMI 32.31 kg/m  ENT normal.  Neck supple. No adenopathy or thyromegaly. PERLA. Lungs are clear, good air entry, no wheezes, rhonchi or rales. S1 and S2 normal, no murmurs, regular rate and rhythm. Abdomen soft without tenderness, guarding, mass or organomegaly. Extremities show no edema, normal peripheral pulses. Neurological is normal, no focal findings.  BREAST EXAM: breasts appear normal, no suspicious masses, no skin or nipple changes or axillary nodes  PELVIC EXAM:  examination not indicated  ASSESSMENT:  No acute complaints today. Patient had her mammogram on 6/17 with no evidence of malignancy. Patient had PAP+HPV in July 2018 and is not due for one today. BP is within normal limits. Patient currently not taking any medications.   PLAN:  --Order, CBC, CMP,  Lipid panel and A1c --HIV at patient request tested negative in 2018  --Will continue to work on therapeutic lifestyle change for weight loss.

## 2018-07-16 LAB — CBC WITH DIFFERENTIAL/PLATELET
Basophils Absolute: 0 10*3/uL (ref 0.0–0.2)
Basos: 0 %
EOS (ABSOLUTE): 0.1 10*3/uL (ref 0.0–0.4)
Eos: 1 %
Hematocrit: 40.2 % (ref 34.0–46.6)
Hemoglobin: 13.1 g/dL (ref 11.1–15.9)
Immature Grans (Abs): 0 10*3/uL (ref 0.0–0.1)
Immature Granulocytes: 0 %
Lymphocytes Absolute: 2.6 10*3/uL (ref 0.7–3.1)
Lymphs: 41 %
MCH: 29.9 pg (ref 26.6–33.0)
MCHC: 32.6 g/dL (ref 31.5–35.7)
MCV: 92 fL (ref 79–97)
Monocytes Absolute: 0.6 10*3/uL (ref 0.1–0.9)
Monocytes: 9 %
Neutrophils Absolute: 3.1 10*3/uL (ref 1.4–7.0)
Neutrophils: 49 %
Platelets: 255 10*3/uL (ref 150–450)
RBC: 4.38 x10E6/uL (ref 3.77–5.28)
RDW: 12.8 % (ref 11.7–15.4)
WBC: 6.4 10*3/uL (ref 3.4–10.8)

## 2018-07-16 LAB — CMP14+EGFR
ALT: 17 IU/L (ref 0–32)
AST: 19 IU/L (ref 0–40)
Albumin/Globulin Ratio: 1.5 (ref 1.2–2.2)
Albumin: 4.2 g/dL (ref 3.8–4.9)
Alkaline Phosphatase: 94 IU/L (ref 39–117)
BUN/Creatinine Ratio: 13 (ref 9–23)
BUN: 9 mg/dL (ref 6–24)
Bilirubin Total: 1.1 mg/dL (ref 0.0–1.2)
CO2: 24 mmol/L (ref 20–29)
Calcium: 9.5 mg/dL (ref 8.7–10.2)
Chloride: 102 mmol/L (ref 96–106)
Creatinine, Ser: 0.72 mg/dL (ref 0.57–1.00)
GFR calc Af Amer: 109 mL/min/{1.73_m2} (ref >59)
GFR calc non Af Amer: 95 mL/min/{1.73_m2} (ref >59)
Globulin, Total: 2.8 g/dL (ref 1.5–4.5)
Glucose: 96 mg/dL (ref 65–99)
Potassium: 4 mmol/L (ref 3.5–5.2)
Sodium: 139 mmol/L (ref 134–144)
Total Protein: 7 g/dL (ref 6.0–8.5)

## 2018-07-16 LAB — HIV ANTIBODY (ROUTINE TESTING W REFLEX): HIV Screen 4th Generation wRfx: NONREACTIVE

## 2018-07-16 LAB — LIPID PANEL
Chol/HDL Ratio: 2.8 ratio (ref 0.0–4.4)
Cholesterol, Total: 177 mg/dL (ref 100–199)
HDL: 64 mg/dL (ref >39)
LDL Calculated: 99 mg/dL (ref 0–99)
Triglycerides: 72 mg/dL (ref 0–149)
VLDL Cholesterol Cal: 14 mg/dL (ref 5–40)

## 2018-08-11 ENCOUNTER — Ambulatory Visit: Payer: BLUE CROSS/BLUE SHIELD

## 2018-08-11 ENCOUNTER — Telehealth: Payer: Self-pay | Admitting: Family Medicine

## 2018-08-11 NOTE — Telephone Encounter (Signed)
Patient is requesting a copy of her labs results be mailed to her.  Pt informed of results and copied placed in mail.  Jazmin Hartsell,CMA

## 2018-08-11 NOTE — Telephone Encounter (Signed)
Pt called requesting to get a call regarding her recent lab results.

## 2018-08-12 ENCOUNTER — Ambulatory Visit: Payer: BLUE CROSS/BLUE SHIELD

## 2018-08-27 ENCOUNTER — Other Ambulatory Visit: Payer: Self-pay

## 2018-08-27 ENCOUNTER — Ambulatory Visit (LOCAL_COMMUNITY_HEALTH_CENTER): Payer: Self-pay

## 2018-08-27 DIAGNOSIS — Z111 Encounter for screening for respiratory tuberculosis: Secondary | ICD-10-CM

## 2018-08-30 ENCOUNTER — Other Ambulatory Visit: Payer: Self-pay

## 2018-08-30 ENCOUNTER — Ambulatory Visit (LOCAL_COMMUNITY_HEALTH_CENTER): Payer: 59

## 2018-08-30 DIAGNOSIS — Z111 Encounter for screening for respiratory tuberculosis: Secondary | ICD-10-CM

## 2018-08-30 LAB — TB SKIN TEST
Induration: 0 mm
TB Skin Test: NEGATIVE

## 2018-09-20 ENCOUNTER — Other Ambulatory Visit: Payer: Self-pay

## 2018-09-20 MED ORDER — MELOXICAM 15 MG PO TABS: 15.0000 mg | ORAL_TABLET | Freq: Every day | ORAL | 3 refills | Status: DC

## 2018-09-20 NOTE — Telephone Encounter (Signed)
Pharmacy refill request for Meloxicam 15mg.  Per Dr. Hyatt's verbal order, ok to refill.  Script has been sent to pharmacy 

## 2019-01-24 ENCOUNTER — Other Ambulatory Visit: Payer: Self-pay | Admitting: Podiatry

## 2019-02-11 ENCOUNTER — Telehealth: Payer: Self-pay | Admitting: *Deleted

## 2019-02-11 NOTE — Telephone Encounter (Signed)
LM for patient to call back.  Will update her on PCP change from Dr. Shawnie Pons to Dr. Manson Passey and will offer an appointment to meet her when she does.  Dontasia Miranda,CMA

## 2019-02-14 NOTE — Telephone Encounter (Signed)
Pt returned phone call to nurse line and informed of PCP change. Pt is grateful for [place] reaching out and informing her of change. At this time, patient has lost her insurance due to being laid off of work and does not wish to schedule an appointment. Pt is working on getting an orange card. Patient informed yearly visit would be due in June of 2021. Advised patient to call back in late April/ early May to schedule yearly appointment.   Veronda Prude, RN

## 2019-06-01 ENCOUNTER — Other Ambulatory Visit: Payer: Self-pay | Admitting: Podiatry

## 2019-06-06 ENCOUNTER — Other Ambulatory Visit: Payer: Self-pay | Admitting: Family Medicine

## 2019-06-06 DIAGNOSIS — Z1231 Encounter for screening mammogram for malignant neoplasm of breast: Secondary | ICD-10-CM

## 2019-07-05 ENCOUNTER — Other Ambulatory Visit: Payer: Self-pay | Admitting: *Deleted

## 2019-07-05 MED ORDER — MELOXICAM 15 MG PO TABS: 15.0000 mg | ORAL_TABLET | Freq: Every day | ORAL | 0 refills | Status: DC

## 2019-07-19 ENCOUNTER — Ambulatory Visit
Admission: RE | Admit: 2019-07-19 | Discharge: 2019-07-19 | Disposition: A | Discharge status: Home / Self Care | Payer: 59 | Source: Ambulatory Visit | Attending: Family Medicine | Admitting: Family Medicine

## 2019-07-19 ENCOUNTER — Ambulatory Visit: Payer: 59

## 2019-07-19 ENCOUNTER — Other Ambulatory Visit: Payer: Self-pay

## 2019-07-19 DIAGNOSIS — Z1231 Encounter for screening mammogram for malignant neoplasm of breast: Secondary | ICD-10-CM

## 2019-07-21 ENCOUNTER — Encounter: Payer: Self-pay | Admitting: Family Medicine

## 2019-07-22 ENCOUNTER — Encounter: Payer: 59 | Admitting: Family Medicine

## 2019-08-02 ENCOUNTER — Other Ambulatory Visit: Payer: 59

## 2019-08-02 ENCOUNTER — Ambulatory Visit (LOCAL_COMMUNITY_HEALTH_CENTER): Payer: Self-pay

## 2019-08-02 ENCOUNTER — Other Ambulatory Visit: Payer: Self-pay

## 2019-08-02 DIAGNOSIS — Z111 Encounter for screening for respiratory tuberculosis: Secondary | ICD-10-CM

## 2019-08-05 ENCOUNTER — Ambulatory Visit (LOCAL_COMMUNITY_HEALTH_CENTER): Payer: Self-pay

## 2019-08-05 ENCOUNTER — Other Ambulatory Visit: Payer: Self-pay

## 2019-08-05 DIAGNOSIS — Z111 Encounter for screening for respiratory tuberculosis: Secondary | ICD-10-CM

## 2019-08-05 LAB — TB SKIN TEST
Induration: 0 mm
TB Skin Test: NEGATIVE

## 2019-08-22 ENCOUNTER — Encounter: Payer: Self-pay | Admitting: Family Medicine

## 2019-09-07 ENCOUNTER — Other Ambulatory Visit: Payer: Self-pay

## 2019-09-07 ENCOUNTER — Ambulatory Visit (INDEPENDENT_AMBULATORY_CARE_PROVIDER_SITE_OTHER): Payer: 59 | Admitting: Student in an Organized Health Care Education/Training Program

## 2019-09-07 ENCOUNTER — Encounter: Payer: Self-pay | Admitting: Student in an Organized Health Care Education/Training Program

## 2019-09-07 ENCOUNTER — Other Ambulatory Visit (HOSPITAL_COMMUNITY)
Admission: RE | Admit: 2019-09-07 | Discharge: 2019-09-07 | Disposition: A | Discharge status: Home / Self Care | Payer: 59 | Source: Ambulatory Visit | Attending: Family Medicine | Admitting: Family Medicine

## 2019-09-07 VITALS — BP 125/80 | HR 92 | Ht 64.0 in | Wt 204.0 lb

## 2019-09-07 DIAGNOSIS — Z01419 Encounter for gynecological examination (general) (routine) without abnormal findings: Secondary | ICD-10-CM

## 2019-09-07 DIAGNOSIS — Z124 Encounter for screening for malignant neoplasm of cervix: Secondary | ICD-10-CM | POA: Insufficient documentation

## 2019-09-07 NOTE — Progress Notes (Signed)
    SUBJECTIVE:   CHIEF COMPLAINT / HPI: Cervical cancer screening  Patient presenting for routine pap smear/well-woman exam without any complaints or concerns today.  PERTINENT  PMH / PSH: pap smear 07/2016 neg for lesions or high-risk HPV  OBJECTIVE:   BP 125/80   Pulse 92   Ht 5\' 4"  (1.626 m)   Wt 204 lb (92.5 kg)   LMP 05/27/2016   SpO2 96%   BMI 35.02 kg/m   General: NAD, pleasant, able to participate in exam Cardiac: RRR, normal heart sounds, no murmurs. 2+ radial and PT pulses bilaterally Respiratory: CTAB, normal effort, No wheezes, rales or rhonchi Pelvic: positive noninfectious appearing cyst on right labia majora. No other lesions or concerning features Breast: normal breast tissue NO tenderness, skin changes, nipple abnormalities/drainage Skin: warm and dry, no rashes noted Neuro: alert and oriented x4, no focal deficits Psych: Normal affect and mood  ASSESSMENT/PLAN:   Well woman exam No complaints or concerns today. Exam was benign Pap smear obtained and manual breast exam completed Patient up to date for mammogram and colonoscopy Labs 1 year ago were completely normal. Not repeating today Follow up with PCP as needed     07/27/2016, DO [facility]

## 2019-09-07 NOTE — Patient Instructions (Addendum)
It was a pleasure to see you today!  To summarize our discussion for this visit:  Today we completed your Pap smear and manual breast exam.  We will let you know the results when they come back.    Some additional health maintenance measures we should update are: Health Maintenance Due  Topic Date Due  . PAP SMEAR-Modifier  08/13/2019  . INFLUENZA VACCINE  08/28/2019  .    Call the clinic at [phone] if your symptoms worsen or you have any concerns.   Thank you for allowing me to take part in your care,  Dr. Jamelle Rushing   Exercising to Stay Healthy To become healthy and stay healthy, it is recommended that you do moderate-intensity and vigorous-intensity exercise. You can tell that you are exercising at a moderate intensity if your heart starts beating faster and you start breathing faster but can still hold a conversation. You can tell that you are exercising at a vigorous intensity if you are breathing much harder and faster and cannot hold a conversation while exercising. Exercising regularly is important. It has many health benefits, such as:  Improving overall fitness, flexibility, and endurance.  Increasing bone density.  Helping with weight control.  Decreasing body fat.  Increasing muscle strength.  Reducing stress and tension.  Improving overall health. How often should I exercise? Choose an activity that you enjoy, and set realistic goals. Your health care provider can help you make an activity plan that works for you. Exercise regularly as told by your health care provider. This may include:  Doing strength training two times a week, such as: ? Lifting weights. ? Using resistance bands. ? Push-ups. ? Sit-ups. ? Yoga.  Doing a certain intensity of exercise for a given amount of time. Choose from these options: ? A total of 150 minutes of moderate-intensity exercise every week. ? A total of 75 minutes of vigorous-intensity exercise every  week. ? A mix of moderate-intensity and vigorous-intensity exercise every week. Children, pregnant women, people who have not exercised regularly, people who are overweight, and older adults may need to talk with a health care provider about what activities are safe to do. If you have a medical condition, be sure to talk with your health care provider before you start a new exercise program. What are some exercise ideas? Moderate-intensity exercise ideas include:  Walking 1 mile (1.6 km) in about 15 minutes.  Biking.  Hiking.  Golfing.  Dancing.  Water aerobics. Vigorous-intensity exercise ideas include:  Walking 4.5 miles (7.2 km) or more in about 1 hour.  Jogging or running 5 miles (8 km) in about 1 hour.  Biking 10 miles (16.1 km) or more in about 1 hour.  Lap swimming.  Roller-skating or in-line skating.  Cross-country skiing.  Vigorous competitive sports, such as football, basketball, and soccer.  Jumping rope.  Aerobic dancing. What are some everyday activities that can help me to get exercise?  Yard work, such as: ? Pushing a Surveyor, mining. ? Raking and bagging leaves.  Washing your car.  Pushing a stroller.  Shoveling snow.  Gardening.  Washing windows or floors. How can I be more active in my day-to-day activities?  Use stairs instead of an elevator.  Take a walk during your lunch break.  If you drive, park your car farther away from your work or school.  If you take public transportation, get off one stop early and walk the rest of the way.  Stand up or walk around  during all of your indoor phone calls.  Get up, stretch, and walk around every 30 minutes throughout the day.  Enjoy exercise with a friend. Support to continue exercising will help you keep a regular routine of activity. What guidelines can I follow while exercising?  Before you start a new exercise program, talk with your health care provider.  Do not exercise so much that you  hurt yourself, feel dizzy, or get very short of breath.  Wear comfortable clothes and wear shoes with good support.  Drink plenty of water while you exercise to prevent dehydration or heat stroke.  Work out until your breathing and your heartbeat get faster. Where to find more information  [place].S. Department of Health and Human Services: ThisPath.fi  Centers for Disease Control and Prevention (CDC): FootballExhibition.com.br Summary  Exercising regularly is important. It will improve your overall fitness, flexibility, and endurance.  Regular exercise also will improve your overall health. It can help you control your weight, reduce stress, and improve your bone density.  Do not exercise so much that you hurt yourself, feel dizzy, or get very short of breath.  Before you start a new exercise program, talk with your health care provider. This information is not intended to replace advice given to you by your health care provider. Make sure you discuss any questions you have with your health care provider. Document Revised: 12/26/2016 Document Reviewed: 12/04/2016 Elsevier Patient Education  2020 [employer].

## 2019-09-12 LAB — CYTOLOGY - PAP
Adequacy: ABSENT
Comment: NEGATIVE
Diagnosis: NEGATIVE
High risk HPV: NEGATIVE

## 2019-09-13 DIAGNOSIS — Z01419 Encounter for gynecological examination (general) (routine) without abnormal findings: Secondary | ICD-10-CM | POA: Insufficient documentation

## 2019-09-13 NOTE — Assessment & Plan Note (Signed)
No complaints or concerns today. Exam was benign Pap smear obtained and manual breast exam completed Patient up to date for mammogram and colonoscopy Labs 1 year ago were completely normal. Not repeating today Follow up with PCP as needed

## 2019-09-26 ENCOUNTER — Encounter: Payer: Self-pay | Admitting: Family Medicine

## 2019-09-26 ENCOUNTER — Telehealth: Payer: Self-pay

## 2019-09-26 NOTE — Telephone Encounter (Signed)
Called with pap smear results, discussed results. Will send letter.  Patient is at work and will call back later--has a few other questions related to health.  Terisa Starr, MD  Family Medicine Teaching Service

## 2019-09-26 NOTE — Telephone Encounter (Signed)
Patient calls nurse line requesting PAP smear results. Patient informed of normal results. Patient requesting letter to be sent to home address regarding these results.   To PCP and Dr. Elita Quick, RN

## 2019-10-07 ENCOUNTER — Ambulatory Visit: Payer: 59 | Admitting: Family Medicine

## 2019-10-28 ENCOUNTER — Ambulatory Visit: Payer: 59 | Admitting: Family Medicine

## 2019-11-21 ENCOUNTER — Ambulatory Visit: Payer: 59 | Admitting: Family Medicine

## 2020-01-16 ENCOUNTER — Ambulatory Visit (INDEPENDENT_AMBULATORY_CARE_PROVIDER_SITE_OTHER): Payer: 59 | Admitting: Student in an Organized Health Care Education/Training Program

## 2020-01-16 ENCOUNTER — Other Ambulatory Visit: Payer: Self-pay

## 2020-01-16 ENCOUNTER — Encounter: Payer: Self-pay | Admitting: Student in an Organized Health Care Education/Training Program

## 2020-01-16 VITALS — BP 120/80 | HR 98 | Wt 195.0 lb

## 2020-01-16 DIAGNOSIS — M546 Pain in thoracic spine: Secondary | ICD-10-CM | POA: Diagnosis not present

## 2020-01-16 DIAGNOSIS — Z0000 Encounter for general adult medical examination without abnormal findings: Secondary | ICD-10-CM | POA: Insufficient documentation

## 2020-01-16 DIAGNOSIS — M545 Low back pain, unspecified: Secondary | ICD-10-CM

## 2020-01-16 DIAGNOSIS — R350 Frequency of micturition: Secondary | ICD-10-CM | POA: Insufficient documentation

## 2020-01-16 DIAGNOSIS — G8929 Other chronic pain: Secondary | ICD-10-CM

## 2020-01-16 DIAGNOSIS — N62 Hypertrophy of breast: Secondary | ICD-10-CM

## 2020-01-16 LAB — POCT URINALYSIS DIP (MANUAL ENTRY)
Bilirubin, UA: NEGATIVE
Glucose, UA: NEGATIVE mg/dL
Ketones, POC UA: NEGATIVE mg/dL
Leukocytes, UA: NEGATIVE
Nitrite, UA: NEGATIVE
Protein Ur, POC: NEGATIVE mg/dL
Spec Grav, UA: 1.02 (ref 1.010–1.025)
Urobilinogen, UA: 1 E.U./dL
pH, UA: 7 (ref 5.0–8.0)

## 2020-01-16 LAB — POCT UA - MICROSCOPIC ONLY

## 2020-01-16 NOTE — Assessment & Plan Note (Addendum)
Urinalysis negative for infection. Likely secondary to large water intake and appears to be within normal limits Recent blood work showed normal kidney function.  Reassured patient. Provided patient with recommendation and instructions for Kegel exercises

## 2020-01-16 NOTE — Patient Instructions (Signed)
It was a pleasure to see you today!  To summarize our discussion for this visit:  You are up to date on all of your cancer screenings which have been normal.  Your kidney function is very healthy based off of your blood work.  Your frequent urination is likely due to drinking a lot of water.  You can try some pelvic floor exercises as described below to help decrease the urgency for going to the bathroom.  Your urine sample is going to microscopy for further evaluation and I will let you know the results.  I have sent in a referral to plastic surgery in order to discuss breast reduction surgery.  Some additional health maintenance measures we should update are: Health Maintenance Due  Topic Date Due  . INFLUENZA VACCINE  08/28/2019  . COVID-19 Vaccine (3 - Booster for Pfizer series) 10/12/2019  .   Please return to our clinic to see me as needed.  Call the clinic at [phone] if your symptoms worsen or you have any concerns.   Thank you for allowing me to take part in your care,  Dr. Jamelle Rushing  Your Home Program  General Guidelines for Pelvic Floor Exercise  Challenge your muscles to do more than they are used to doing. The quality of the exercise is more important that the number you perform.  Avoid straining, holding your breath or using buttock or leg muscles while you exercise the pelvic floor muscles.  Count out loud and continue breathing to avoid straining.  Relax your body and breathe during your exercises.  Coordinate your breathing with your pelvic floor contraction by blowing out or exhaling while you contract your pelvic floor muscles.   Concentrate on activating both the sphincters and levator ani muscles of the pelvic floor with each exercise.  Position for the Exercises  Start lying down with your knees bent and supported with pillows.  Once you've gained awareness and can feel the contractions you may perform the exercises either sitting or standing.   For example, you can do them while driving, working on the computer, or waiting in lines.  Quick Contractions  Repeat this exercise multiple times.  Do the exercise as many times per day as you think about it.  Rapidly contract your pelvic floor muscles and hold for 2 seconds relax for 2 seconds.  Try to do the contraction on breathing exhalation.  Endurance Contractions  Repeat this multiple times.  Do the exercise as many times per day as you think about it.  Pull your pelvic floor muscles up and in and hold for 10 seconds then relax for 2 seconds.  Count out loud while you are holding the contraction to make sure that you are breathing throughout the exercise and not straining.    2007, [employer] Doc.37

## 2020-01-16 NOTE — Assessment & Plan Note (Signed)
Patient has chronic back pain treated with over-the-counter medications.  Pain interferes with her ability to complete tasks at work. She wears 2 bras daily in order to support the weight of her breasts and thinks that the weight is causing her back pain. She would like a consultation with plastic surgery to discuss breast reduction and help improve her back pain. Referral placed

## 2020-01-16 NOTE — Progress Notes (Signed)
   SUBJECTIVE:   CHIEF COMPLAINT / HPI: breast concerns, cancer concerns, urinary frequency  Breast concerns-patient states that she has struggled with having large breasts for much of her life.  She is requesting a referral to plastic surgery for breast reduction associated with upper back pain.  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  She would like to be able to have no restrictions at work and thinks that having breast reduction will help her with this.  She currently uses 2 bras daily to help support the weight of her breasts.  The pain is worse in her thoracic area and trapezius.  Urinary frequency-drinks several bottles of water per day to stay hydrated.  Denies abnormal urine or dysuria.  She is concerned that her urinary frequency is due to kidney disease.  Cancer screening-patient knows several friends and family members who have been diagnosed with cancer or died within the past couple of years and is concerned that she may also have cancer.  She wants to know what types of screenings she is due for today.  OBJECTIVE:   BP 120/80   Pulse 98   Wt 195 lb (88.5 kg)   LMP 05/27/2016   SpO2 97%   BMI 33.47 kg/m   General: NAD, pleasant, able to participate in exam Cardiac: RRR, normal heart sounds, no murmurs. 2+ radial and PT pulses bilaterally Respiratory: CTAB, normal effort, No wheezes, rales or rhonchi Back: no midline tenderness. Patient has tenderness to lateral musculature of thoracic spine and trapezius muscles bilaterally, worse on right. She does not have specific trigger points or spasm Extremities: no edema. WWP. Skin: warm and dry, no rashes noted Neuro: alert and oriented, no focal deficits Psych: anxious affect and mood, pressured speech  ASSESSMENT/PLAN:   Back pain Patient has chronic back pain treated with  over-the-counter medications.  Pain interferes with her ability to complete tasks at work. She wears 2 bras daily in order to support the weight of her breasts and thinks that the weight is causing her back pain. She would like a consultation with plastic surgery to discuss breast reduction and help improve her back pain. Referral placed  Frequent urination Urinalysis negative for infection. Likely secondary to large water intake and appears to be within normal limits Recent blood work showed normal kidney function.  Reassured patient. Provided patient with recommendation and instructions for Kegel exercises   Healthcare maintenance Patient is up-to-date on all of the recommended cancer screenings which have all been normal thus far. Reassured patient. Patient appears to have some anxiety associated with healthcare and medical illness related to having a mother with significant health issues.  She may benefit from treatment of anxiety in the future.     Leeroy Bock, DO [facility]

## 2020-01-16 NOTE — Assessment & Plan Note (Signed)
Patient is up-to-date on all of the recommended cancer screenings which have all been normal thus far. Reassured patient. Patient appears to have some anxiety associated with healthcare and medical illness related to having a mother with significant health issues.  She may benefit from treatment of anxiety in the future.

## 2020-01-23 ENCOUNTER — Other Ambulatory Visit: Payer: Self-pay | Admitting: Podiatry

## 2020-02-29 ENCOUNTER — Other Ambulatory Visit: Payer: Self-pay | Admitting: Podiatry

## 2020-02-29 NOTE — Telephone Encounter (Signed)
Please advise 

## 2020-03-01 ENCOUNTER — Institutional Professional Consult (permissible substitution): Payer: 59 | Admitting: Plastic Surgery

## 2020-03-21 ENCOUNTER — Ambulatory Visit (INDEPENDENT_AMBULATORY_CARE_PROVIDER_SITE_OTHER): Payer: 59 | Admitting: Plastic Surgery

## 2020-03-21 ENCOUNTER — Encounter: Payer: Self-pay | Admitting: Plastic Surgery

## 2020-03-21 ENCOUNTER — Other Ambulatory Visit: Payer: Self-pay

## 2020-03-21 VITALS — BP 128/72 | HR 86 | Temp 97.1°F | Ht 64.0 in | Wt 195.0 lb

## 2020-03-21 DIAGNOSIS — M4004 Postural kyphosis, thoracic region: Secondary | ICD-10-CM

## 2020-03-21 DIAGNOSIS — N62 Hypertrophy of breast: Secondary | ICD-10-CM | POA: Diagnosis not present

## 2020-03-21 DIAGNOSIS — M545 Low back pain, unspecified: Secondary | ICD-10-CM

## 2020-03-21 DIAGNOSIS — M546 Pain in thoracic spine: Secondary | ICD-10-CM

## 2020-03-21 NOTE — Progress Notes (Signed)
   Referring Provider Westley Chandler, MD [address] [place],  [place] [address]   CC:  Chief Complaint  Patient presents with  . Advice Only      [name] is an 60 y.o. female.  HPI: Patient presents to discuss breast reduction.  She had years of back pain, neck pain and shoulder grooving related to her large breasts.  She is required surgery to her cervical spine in 2012.  She has no family history of breast cancer and no previous breast procedures or biopsies.  Her last mammogram was in October.  She is currently a 44 double D and wants to be around a B or C cup.  She is not a diabetic and does not smoke.  She tried over-the-counter medications, warm packs, cold packs and supportive bras with little relief of her back pain.  No Known Allergies  Outpatient Encounter Medications as of 03/21/2020  Medication Sig  . ibuprofen (ADVIL,MOTRIN) 200 MG tablet Take 200 mg by mouth every 6 (six) hours as needed.  . meloxicam (MOBIC) 15 MG tablet TAKE 1 TABLET(15 MG) BY MOUTH DAILY   No facility-administered encounter medications on file as of 03/21/2020.     Past Medical History:  Diagnosis Date  . Environmental allergies     Past Surgical History:  Procedure Laterality Date  . CERVICAL FUSION  2012    Family History  Problem Relation Age of Onset  . Diabetes Mother   . Hypertension Mother   . Cancer Maternal Grandmother        pancreas  . Hypertension Maternal Grandmother   . Colon cancer Neg Hx   . Breast cancer Neg Hx     Social History   Social History Narrative  . Not on file     Review of Systems General: Denies fevers, chills, weight loss CV: Denies chest pain, shortness of breath, palpitations  Physical Exam Vitals with BMI 01/16/2020 09/07/2019 07/15/2018  Height - 5\' 4"  5\' 4"   Weight 195 lbs 204 lbs 188 lbs 4 oz  BMI - 35 32.3  Systolic 120 125  Diastolic 80 80 74  Pulse 98 92 66    General:  No acute distress,  Alert and oriented,  Non-Toxic, Normal speech and affect Breast: She has grade 3 ptosis.  Sternal notch to nipple is 35 cm bilaterally.  Nipple to fold is 17 cm bilaterally.  I do not see any obvious scars or masses.  Assessment/Plan The patient has bilateral symptomatic macromastia.  She is a good candidate for a breast reduction.  She is interested in pursuing surgical treatment.  She has tried supportive garments and fitted bras with no relief.  The details of breast reduction surgery were discussed.  I explained the procedure in detail along the with the expected scars.  The risks were discussed in detail and include bleeding, infection, damage to surrounding structures, need for additional procedures, nipple loss, change in nipple sensation, persistent pain, contour irregularities and asymmetries.  I explained that breast feeding is often not possible after breast reduction surgery.  We discussed the expected postoperative course with an overall recovery period of about 1 month.  She demonstrated full understanding of all risks.  We discussed her personal risk factors.  I anticipate approximately 700g of tissue removed from each side.   03/21/2020, 9:39 AM

## 2020-03-28 ENCOUNTER — Other Ambulatory Visit: Payer: Self-pay | Admitting: Podiatry

## 2020-04-09 ENCOUNTER — Encounter: Payer: 59 | Admitting: Surgical

## 2020-04-11 ENCOUNTER — Other Ambulatory Visit (HOSPITAL_COMMUNITY): Payer: 59

## 2020-04-19 ENCOUNTER — Encounter: Payer: 59 | Admitting: Plastic Surgery

## 2020-04-26 ENCOUNTER — Encounter: Payer: 59 | Admitting: Plastic Surgery

## 2020-05-14 ENCOUNTER — Ambulatory Visit: Payer: 59 | Admitting: Family Medicine

## 2020-06-01 ENCOUNTER — Telehealth: Payer: Self-pay | Admitting: Family Medicine

## 2020-06-01 ENCOUNTER — Ambulatory Visit: Payer: 59 | Admitting: Family Medicine

## 2020-06-01 ENCOUNTER — Encounter: Payer: 59 | Admitting: Surgical

## 2020-06-01 NOTE — Progress Notes (Deleted)
Patient ID: [name], female    DOB: 27-Aug-1960, 60 y.o.   MRN: [id number]  No chief complaint on file.   No diagnosis found.   History of Present Illness: [name] is a 60 y.o.  female  with a history of macromastia.  She presents for preoperative evaluation for upcoming procedure, Bilateral Breast Reduction, scheduled for 07/02/2020 with Dr.  Arita Miss  The patient {HAS HAS [id number]} had problems with anesthesia. ***  Summary of Previous Visit: No family history of breast cancer and no previous breast procedures or biopsies.  Last mammogram was in October 2021.  She is a 46 double D and wants to be around a B or C cup.  She is not a diabetic and does not smoke.  Estimated excess breast tissue to be removed at time of surgery: 700 g  on the left and 700 g on the right.  Job: ***  PMH Significant for: ***   Past Medical History: Allergies: No Known Allergies  Current Medications:  Current Outpatient Medications:  .  ibuprofen (ADVIL,MOTRIN) 200 MG tablet, Take 200 mg by mouth every 6 (six) hours as needed., Disp: , Rfl:  .  meloxicam (MOBIC) 15 MG tablet, TAKE 1 TABLET(15 MG) BY MOUTH DAILY, Disp: 30 tablet, Rfl: 0  Past Medical Problems: Past Medical History:  Diagnosis Date  . Environmental allergies     Past Surgical History: Past Surgical History:  Procedure Laterality Date  . CERVICAL FUSION  2012    Social History: Social History   Socioeconomic History  . Marital status: Single    Spouse name: Not on file  . Number of children: Not on file  . Years of education: Not on file  . Highest education level: Not on file  Occupational History  . Not on file  Tobacco Use  . Smoking status: Never Smoker  . Smokeless tobacco: Never Used  Substance and Sexual Activity  . Alcohol use: Yes    Alcohol/week: 4.0 standard drinks    Types: 4 Glasses of wine per week  . Drug use: No  . Sexual activity: Not on file  Other Topics Concern  . Not on  file  Social History Narrative  . Not on file   Social Determinants of Health   Financial Resource Strain: Not on file  Food Insecurity: Not on file  Transportation Needs: Not on file  Physical Activity: Not on file  Stress: Not on file  Social Connections: Not on file  Intimate Partner Violence: Not on file    Family History: Family History  Problem Relation Age of Onset  . Diabetes Mother   . Hypertension Mother   . Cancer Maternal Grandmother        pancreas  . Hypertension Maternal Grandmother   . Colon cancer Neg Hx   . Breast cancer Neg Hx     Review of Systems: ROS  Physical Exam: Vital Signs LMP 05/27/2016   Physical Exam Constitutional:      General: Not in acute distress.    Appearance: Normal appearance. Not ill-appearing.  HENT:     Head: Normocephalic and atraumatic.  Eyes:     Pupils: Pupils are equal, round Neck:     Musculoskeletal: Normal range of motion.  Cardiovascular:     Rate and Rhythm: Normal rate    Pulses: Normal pulses.  Pulmonary:     Effort: Pulmonary effort is normal. No respiratory distress.  Abdominal:     General: Abdomen  is flat. There is no distension.  Musculoskeletal: Normal range of motion.  Skin:    General: Skin is warm and dry.     Findings: No erythema or rash.  Neurological:     General: No focal deficit present.     Mental Status: Alert and oriented to person, place, and time. Mental status is at baseline.     Motor: No weakness.  Psychiatric:        Mood and Affect: Mood normal.        Behavior: Behavior normal.    Assessment/Plan: The patient is scheduled for bilateral breast reduction with Dr. Arita Miss.  Risks, benefits, and alternatives of procedure discussed, questions answered and consent obtained.    Smoking Status: ***; Counseling Given? *** Last Mammogram: 07/19/2019; Results: Negative  Caprini Score: ***; Risk Factors include: ***, BMI *** 25, and length of planned surgery. Recommendation for  mechanical *** pharmacological prophylaxis. Encourage early ambulation.   Pictures obtained: ***  Post-op Rx sent to pharmacy: ***  Patient was provided with the breast reduction and General Surgical Risk consent document and Pain Medication Agreement prior to their appointment.  They had adequate time to read through the risk consent documents and Pain Medication Agreement. We also discussed them in person together during this preop appointment. All of their questions were answered to their satisfaction.  Recommended calling if they have any further questions.  Risk consent form and Pain Medication Agreement to be scanned into patient's chart.  The risk that can be encountered with breast reduction were discussed and include the following but not limited to these:  Breast asymmetry, fluid accumulation, firmness of the breast, inability to breast feed, loss of nipple or areola, skin loss, decrease or no nipple sensation, fat necrosis of the breast tissue, bleeding, infection, healing delay.  There are risks of anesthesia, changes to skin sensation and injury to nerves or blood vessels.  The muscle can be temporarily or permanently injured.  You may have an allergic reaction to tape, suture, glue, blood products which can result in skin discoloration, swelling, pain, skin lesions, poor healing.  Any of these can lead to the need for revisonal surgery or stage procedures.  A reduction has potential to interfere with diagnostic procedures.  Nipple or breast piercing can increase risks of infection.  This procedure is best done when the breast is fully developed.  Changes in the breast will continue to occur over time.  Pregnancy can alter the outcomes of previous breast reduction surgery, weight gain and weigh loss can also effect the long term appearance.   Lipo?***  Electronically signed by: Kermit Balo Carin Shipp, PA-C 06/01/2020 8:03 AM

## 2020-06-01 NOTE — Telephone Encounter (Signed)
Called patient about multiple missed appointments--has sick mother (ongoing cancer treatment) and works as Water engineer (schedule variable).  Appointment has been rescheduled.  Terisa Starr, MD  Family Medicine Teaching Service

## 2020-06-11 ENCOUNTER — Other Ambulatory Visit: Payer: Self-pay | Admitting: Podiatry

## 2020-06-11 NOTE — Telephone Encounter (Signed)
Please advise 

## 2020-06-19 ENCOUNTER — Other Ambulatory Visit: Payer: Self-pay | Admitting: Family Medicine

## 2020-06-19 DIAGNOSIS — Z1231 Encounter for screening mammogram for malignant neoplasm of breast: Secondary | ICD-10-CM

## 2020-06-29 ENCOUNTER — Ambulatory Visit (INDEPENDENT_AMBULATORY_CARE_PROVIDER_SITE_OTHER): Payer: 59 | Admitting: Family Medicine

## 2020-06-29 ENCOUNTER — Other Ambulatory Visit (HOSPITAL_COMMUNITY): Payer: 59

## 2020-06-29 ENCOUNTER — Ambulatory Visit (INDEPENDENT_AMBULATORY_CARE_PROVIDER_SITE_OTHER): Payer: 59

## 2020-06-29 ENCOUNTER — Encounter: Payer: Self-pay | Admitting: Family Medicine

## 2020-06-29 ENCOUNTER — Other Ambulatory Visit: Payer: Self-pay

## 2020-06-29 VITALS — BP 118/80 | HR 87 | Wt 198.0 lb

## 2020-06-29 DIAGNOSIS — Z791 Long term (current) use of non-steroidal anti-inflammatories (NSAID): Secondary | ICD-10-CM | POA: Diagnosis not present

## 2020-06-29 DIAGNOSIS — Z23 Encounter for immunization: Secondary | ICD-10-CM | POA: Diagnosis not present

## 2020-06-29 DIAGNOSIS — E559 Vitamin D deficiency, unspecified: Secondary | ICD-10-CM

## 2020-06-29 DIAGNOSIS — L658 Other specified nonscarring hair loss: Secondary | ICD-10-CM | POA: Diagnosis not present

## 2020-06-29 DIAGNOSIS — Z0000 Encounter for general adult medical examination without abnormal findings: Secondary | ICD-10-CM | POA: Diagnosis not present

## 2020-06-29 NOTE — Progress Notes (Signed)
    SUBJECTIVE:   CHIEF COMPLAINT / HPI:   [name] is a very pleasant 60 year old woman with history significant for obesity and cervical spine fusion presenting today for her annual exam.  She has several questions.  The patient reports a history of vitamin D insufficiency.  She is currently taking 6000 international units of vitamin D per day.  The patient reports a history of hair loss.  She has been using minoxidil with success.  She was previously being seen by dermatology.  She has changed her hair styles and Chane improvement.  Much of her alopecia is along the anterior portion of her forehead.  She is concerned for possible underlying condition causing hair loss.  The patient reports a family history of diabetes.  She would like to be screened for this today.  The patient's mother is currently undergoing treatment for head and neck cancer, she had squamous of carcinoma of the neck related to alcohol and tobacco use.  The patient does not smoke.  She drinks wine 2-3 times per week at most.  She is interested in her third COVID-vaccine.  Patient has a history of an ankle injury and takes meloxicam most days.  She denies stomach upset or changes in output.   PERTINENT  PMH / PSH/Family/Social History : Updated and reviewed see history tab  OBJECTIVE:   BP 118/80   Pulse 87   Wt 198 lb (89.8 kg)   LMP 05/27/2016   SpO2 97%   BMI 33.99 kg/m   Today's weight:  Last Weight  Most recent update: 06/29/2020  9:43 AM   Weight  89.8 kg (198 lb)           Review of prior weights: [employer]   06/29/20 0943  Weight: 198 lb (89.8 kg)    HEENT: EOMI. Sclera without injection or icterus. MMM. External auditory canal examined and WNL. TM normal appearance, no erythema or bulging. + hair breaking along anterior hair line, no mass  Neck: Supple.  Cardiac: Regular rate and rhythm. Normal S1/S2. No murmurs, rubs, or gallops appreciated. Lungs: Clear bilaterally to  ascultation.  Abdomen: Normoactive bowel sounds. No tenderness to deep or light palpation. No rebound or guarding.    Neuro: Normal speech and affect Ext: No edema Psych: Pleasant and appropriate     ASSESSMENT/PLAN:   Annual Exam We discussed healthy eating habits, moderate physical activity for 30 minutes 5 times per week (or 150 minutes), safe sex practices, avoiding tobacco products, safe alcohol consumption, and safe driving habits.   History of vitamin D insufficiency We discussed the very literature on this, as the persistent association with poor outcomes but the lack of solid evidence demonstrating that vitamin D actually improves outcomes.  I recommend dietary vitamin D.  Level today to better guide her.  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  Continue minoxidil, refer to dermatology who she previously saw for this condition.  To further evaluate we will obtain a CBC  Long-term NSAID use Discussed risks, obtaining kidney function and CBC today to evaluate for anemia given longstanding years.  She will reduce this use.  Healthcare maintenance Lipid panel A1c COVID-vaccine today    Terisa Starr, MD  Family Medicine Teaching Service  [facility]

## 2020-06-29 NOTE — Patient Instructions (Addendum)
It was wonderful to see you today.  Please bring ALL of your medications with you to every visit.   Today we talked about:  -- Reduce meloxicam use  - We will check your blood work today  -- You will be called about your dermatology appointment    Thank you for choosing [facility].   Please call [phone] with any questions about today's appointment.  Follow up in 2 months   Terisa Starr, MD  Family Medicine

## 2020-06-29 NOTE — Progress Notes (Signed)
   Covid-19 Vaccination Clinic  Name:  [name]    MRN: [id number] DOB: Jul 07, 1960  06/29/2020  [name] was observed post Covid-19 immunization for 15 minutes without incident. She was provided with Vaccine Information Sheet and instruction to access the V-Safe system.   [name] was instructed to call 911 with any severe reactions post vaccine: Marland Kitchen Difficulty breathing  . Swelling of face and throat  . A fast heartbeat  . A bad rash all over body  . Dizziness and weakness

## 2020-06-30 LAB — CBC
Hematocrit: 38.4 % (ref 34.0–46.6)
Hemoglobin: 12.9 g/dL (ref 11.1–15.9)
MCH: 29.9 pg (ref 26.6–33.0)
MCHC: 33.6 g/dL (ref 31.5–35.7)
MCV: 89 fL (ref 79–97)
Platelets: 246 10*3/uL (ref 150–450)
RBC: 4.32 x10E6/uL (ref 3.77–5.28)
RDW: 12 % (ref 11.7–15.4)
WBC: 7.2 10*3/uL (ref 3.4–10.8)

## 2020-06-30 LAB — BASIC METABOLIC PANEL
BUN/Creatinine Ratio: 18 (ref 9–23)
BUN: 14 mg/dL (ref 6–24)
CO2: 25 mmol/L (ref 20–29)
Calcium: 9.7 mg/dL (ref 8.7–10.2)
Chloride: 102 mmol/L (ref 96–106)
Creatinine, Ser: 0.8 mg/dL (ref 0.57–1.00)
Glucose: 110 mg/dL — ABNORMAL HIGH (ref 65–99)
Potassium: 3.9 mmol/L (ref 3.5–5.2)
Sodium: 142 mmol/L (ref 134–144)
eGFR: 86 mL/min/{1.73_m2} (ref >59)

## 2020-06-30 LAB — LIPID PANEL
Chol/HDL Ratio: 3.2 ratio (ref 0.0–4.4)
Cholesterol, Total: 178 mg/dL (ref 100–199)
HDL: 56 mg/dL (ref >39)
LDL Chol Calc (NIH): 106 mg/dL — ABNORMAL HIGH (ref 0–99)
Triglycerides: 87 mg/dL (ref 0–149)
VLDL Cholesterol Cal: 16 mg/dL (ref 5–40)

## 2020-06-30 LAB — HEMOGLOBIN A1C
Est. average glucose Bld gHb Est-mCnc: 114 mg/dL
Hgb A1c MFr Bld: 5.6 % (ref 4.8–5.6)

## 2020-06-30 LAB — VITAMIN D 25 HYDROXY (VIT D DEFICIENCY, FRACTURES): Vit D, 25-Hydroxy: 51.4 ng/mL (ref 30.0–100.0)

## 2020-07-02 ENCOUNTER — Ambulatory Visit (HOSPITAL_BASED_OUTPATIENT_CLINIC_OR_DEPARTMENT_OTHER): Admit: 2020-07-02 | Payer: 59 | Admitting: Plastic Surgery

## 2020-07-02 ENCOUNTER — Encounter (HOSPITAL_BASED_OUTPATIENT_CLINIC_OR_DEPARTMENT_OTHER): Payer: Self-pay

## 2020-07-02 ENCOUNTER — Telehealth: Payer: Self-pay | Admitting: Family Medicine

## 2020-07-02 SURGERY — MAMMOPLASTY, REDUCTION
Anesthesia: General | Site: Breast | Laterality: Bilateral

## 2020-07-02 NOTE — Telephone Encounter (Signed)
Called with results. CBC, BMP normal (non fasting). HbA1C normal (5.6). Continue vitamin D--1000 IU.   All questions answered. No indication for statin.  Terisa Starr, MD  Family Medicine Teaching Service    The 10-year ASCVD risk score Denman George DC Montez Hageman., et al., 2013) is: 2.7%   Values used to calculate the score:     Age: 60 years     Sex: Female     Is Non-Hispanic African American: Yes     Diabetic: No     Tobacco smoker: No     Systolic Blood Pressure: 118 mmHg     Is BP treated: No     HDL Cholesterol: 56 mg/dL     Total Cholesterol: 178 mg/dL

## 2020-07-12 ENCOUNTER — Encounter: Payer: 59 | Admitting: Plastic Surgery

## 2020-07-19 ENCOUNTER — Encounter: Payer: 59 | Admitting: Plastic Surgery

## 2020-07-24 ENCOUNTER — Other Ambulatory Visit: Payer: Self-pay | Admitting: Podiatry

## 2020-08-16 ENCOUNTER — Ambulatory Visit
Admission: RE | Admit: 2020-08-16 | Discharge: 2020-08-16 | Disposition: A | Discharge status: Home / Self Care | Payer: 59 | Source: Ambulatory Visit | Attending: Family Medicine | Admitting: Family Medicine

## 2020-08-16 ENCOUNTER — Other Ambulatory Visit: Payer: Self-pay

## 2020-08-16 DIAGNOSIS — Z1231 Encounter for screening mammogram for malignant neoplasm of breast: Secondary | ICD-10-CM

## 2020-08-17 ENCOUNTER — Encounter: Payer: Self-pay | Admitting: Family Medicine

## 2020-11-08 ENCOUNTER — Encounter: Payer: Self-pay | Admitting: Family Medicine

## 2020-11-08 ENCOUNTER — Telehealth (INDEPENDENT_AMBULATORY_CARE_PROVIDER_SITE_OTHER): Payer: 59 | Admitting: Family Medicine

## 2020-11-08 ENCOUNTER — Ambulatory Visit: Payer: 59 | Admitting: Family Medicine

## 2020-11-08 DIAGNOSIS — E6609 Other obesity due to excess calories: Secondary | ICD-10-CM

## 2020-11-08 NOTE — Progress Notes (Signed)
[facility] Telemedicine Visit  Patient consented to have virtual visit and was identified by name and date of birth. Method of visit: Telephone  Encounter participants: Patient: [name] - located at home (after work, worked all night) Provider: Westley Chandler - located at [facility] Others (if applicable): none  Chief Complaint: left arm tingling   HPI:  Ms. [name] is a 60 year old woman with history significant for cervical fusion presenting via telemedicine visit for left arm tingling.  This was initially in person but she reports she worked all night was very tired.   She reports about a week ago she noticed intermittent strange feeling in her left forearm.  This lasted a few minutes she thinks and is now resolved.  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  She has since rearranged her sleeping position and is improved.  No associated difficulty speaking, lower extremity numbness or weakness, neck pain, chest pain, dyspnea on exertion.  She does report she is covering from a viral respiratory illness but feels this is improved and reports her symptoms are all resolved.  She has tried nothing for her symptoms.  She does find that if she takes a multivitamin and 2000 IU  vitamin D her symptoms are improved.  She denies persistent numbness, persistent tingling, shoulder pain.   The patient is interested in getting back to exercise.  She thinks her insurance will start to pay for gym membership in 2023.  She has a history of obesity and would like to become more active.  She is interested in the prep program.  ROS: per HPI  Pertinent PMHx: Cervical neck fusion.  Exam:  LMP 05/27/2016   Speaking in full sentences, pleasant and appropriate.  Assessment/Plan: Intermittent but resolved left arm sensation, not precipitated by exertion.  No associated neurologic deficits and is improved relatively rapidly.  This appears to be  associated with position.  I suspect it may may actually be related to her cervical spine as she has significant disease in this area and his history of effusion.  Discussed signs and symptoms to return to the ED including symptoms of a myocardial infarction or cerebrovascular event.  She is to monitor this and let me know if it worsens or returns.  Obesity, referral to PrEP program.  At follow up--needs flu shot. Check on arm condition. Consider imaging, additional evaluation. Discussed return precautions and recommendations.   Time spent during visit with patient: 9 minutes

## 2020-11-23 ENCOUNTER — Encounter: Payer: Self-pay | Admitting: Family Medicine

## 2020-11-23 ENCOUNTER — Other Ambulatory Visit: Payer: Self-pay

## 2020-11-23 ENCOUNTER — Ambulatory Visit (INDEPENDENT_AMBULATORY_CARE_PROVIDER_SITE_OTHER): Payer: 59 | Admitting: Family Medicine

## 2020-11-23 VITALS — BP 124/79 | HR 85 | Wt 199.8 lb

## 2020-11-23 DIAGNOSIS — Z0000 Encounter for general adult medical examination without abnormal findings: Secondary | ICD-10-CM

## 2020-11-23 DIAGNOSIS — L239 Allergic contact dermatitis, unspecified cause: Secondary | ICD-10-CM

## 2020-11-23 DIAGNOSIS — Z23 Encounter for immunization: Secondary | ICD-10-CM

## 2020-11-23 MED ORDER — SHINGRIX 50 MCG/0.5ML IM SUSR: 0.5000 mL | INTRAMUSCULAR | 0 refills | Status: AC

## 2020-11-23 MED ORDER — TRIAMCINOLONE ACETONIDE 0.025 % EX OINT: 1.0000 "application " | TOPICAL_OINTMENT | Freq: Two times a day (BID) | CUTANEOUS | 0 refills | Status: DC

## 2020-11-23 NOTE — Assessment & Plan Note (Signed)
Shingles vaccine printed.  We discussed the need for this vaccine and why it was important to get.  Influenza given today.  She reports she is up-to-date on her COVID-vaccine.

## 2020-11-23 NOTE — Patient Instructions (Addendum)
It was wonderful to see you today.  Please bring ALL of your medications with you to every visit.   Today we talked about:  --Using a cream I sent to your pharmacy on your arm  ---Calling if your numbness in your hand returns  -- You are up to date on cancer screening  We talked about getting the shingles vaccines. This is administered at most pharmacies. You do not need a prescription for this--I gave you one just as a reminder The shingles vaccine prevents 'shingles' or zoster---this is re-activation of chickenpox This can be a very painful rash  I recommend it for all adults 50 and over  There are two doses given 2 to 6 months apart  You can go to your pharmacy to get this vaccine    Thank you for choosing [facility].   Please call [phone] with any questions about today's appointment.  Please be sure to schedule follow up at the front  desk before you leave today.   Terisa Starr, MD  Family Medicine

## 2020-11-23 NOTE — Progress Notes (Signed)
    SUBJECTIVE:   CHIEF COMPLAINT: left hand tingling at time  HPI:   [name] is a 60 y.o. yo with history notable for obesity presenting for right arm rash, follow up and several concerns.  The patient reports her mother is currently on hospice treatment for stomach cancer.  She reports she thinks this is related to alcohol use disorder.  She is very concerned and would like to know if she can have blood test or something to check for cancer.  She is up-to-date on her colonoscopy.  She had a normal colonoscopy 2014.  She had a mammogram in the summer 2022.  She is a non-smoker.  She is up-to-date on her Pap. No unintentional weight loss, cough, fevers, night sweats.   The patient reports that she is been using some products on her hair.  She reports a new slightly pruritic rash on her right upper arm.  She is not sure what this is from but thinks it might be from one of the products.  She is tried nothing for this but has stopped using the products.  The patient reports about twice years she gets bronchitis or cold.  This last about a week and includes upper respiratory symptoms and an intermittent cough that resolves.  She is a non-smoker.  Patient reports her left arm numbness and tingling have resolved after not sleeping on the floor anymore.  PERTINENT  PMH / PSH/Family/Social History : Updated and reviewed as appropriate  OBJECTIVE:   BP 124/79   Pulse 85   Wt 199 lb 12.8 oz (90.6 kg)   LMP 05/27/2016   SpO2 98%   BMI 34.30 kg/m   Today's weight:  Last Weight  Most recent update: 11/23/2020 10:31 AM    Weight  90.6 kg (199 lb 12.8 oz)            Review of prior weights: Filed Weights   11/23/20 1030  Weight: 199 lb 12.8 oz (90.6 kg)    Well-appearing woman in no distress.  She is somewhat anxious.  Cardiac exam regular rate and rhythm no murmurs rubs or gallops.  Lungs are clear there is no edema.  On her right upper extremity over the biceps area there is  a 3 x 2 cm erythematous slightly raised patch with no overlying scale that has some excoriations and middle. ASSESSMENT/PLAN:   Routine cancer screening, she is up-to-date on breast cancer, cervical cancer and colorectal cancer screening.  Given her lack of risk factors there is no indication for lung cancer screening.  She has no symptoms of cancer no adenopathy on her exam today.  Contact dermatitis likely etiology is the product use in her hair.  Does not appear to be tinea.  Prescription for triamcinolone given.  Reviewed reasons to call and return to care.  Healthcare maintenance Shingles vaccine printed.  We discussed the need for this vaccine and why it was important to get.  Influenza given today.  She reports she is up-to-date on her COVID-vaccine.       Terisa Starr, MD  Family Medicine Teaching Service  [facility]

## 2021-04-08 ENCOUNTER — Telehealth: Payer: Self-pay

## 2021-04-08 DIAGNOSIS — Z23 Encounter for immunization: Secondary | ICD-10-CM

## 2021-04-08 MED ORDER — SHINGRIX 50 MCG/0.5ML IM SUSR: 0.5000 mL | INTRAMUSCULAR | 0 refills | Status: AC

## 2021-04-08 NOTE — Telephone Encounter (Signed)
Attempted to call patient. Reached voicemail, left generic voicemail to call back. If patient calls back please ask her times that work for her schedule. ? ?- Shingles sent to [facility] ?- I recommend she be seen for her arm symptoms Can be with any provider--I only have morning clinics  ? ?Terisa Starr, MD  ?Family Medicine Teaching Service  ? ?

## 2021-04-08 NOTE — Telephone Encounter (Signed)
Patient calls nurse line requesting to speak with Dr. Manson Passey. Patient reports that she attempted to schedule follow up with Dr. Manson Passey, however, patient is only available after 3 pm.  ? ?Patient reports continued issues with left arm numbness and tingling and inquiring about getting Vitamin D levels checked. Patient is also asking if she can get a new prescription for Shingles vaccine.  ? ?Offered to schedule with another provider, patient declined, asking to only speak with Dr. Manson Passey.  ? ?Will forward to PCP.  ? ?Veronda Prude, RN ? ?

## 2021-04-16 ENCOUNTER — Other Ambulatory Visit: Payer: Self-pay

## 2021-04-16 ENCOUNTER — Encounter: Payer: Self-pay | Admitting: Family Medicine

## 2021-04-16 ENCOUNTER — Ambulatory Visit (INDEPENDENT_AMBULATORY_CARE_PROVIDER_SITE_OTHER): Payer: BLUE CROSS/BLUE SHIELD | Admitting: Family Medicine

## 2021-04-16 VITALS — BP 158/67 | HR 95 | Wt 204.2 lb

## 2021-04-16 DIAGNOSIS — R7989 Other specified abnormal findings of blood chemistry: Secondary | ICD-10-CM

## 2021-04-16 DIAGNOSIS — R209 Unspecified disturbances of skin sensation: Secondary | ICD-10-CM | POA: Diagnosis not present

## 2021-04-16 DIAGNOSIS — R739 Hyperglycemia, unspecified: Secondary | ICD-10-CM | POA: Diagnosis not present

## 2021-04-16 DIAGNOSIS — Z9189 Other specified personal risk factors, not elsewhere classified: Secondary | ICD-10-CM

## 2021-04-16 NOTE — Progress Notes (Signed)
? ? ?  SUBJECTIVE:  ? ?CHIEF COMPLAINT / HPI: arm tingling  ? ?Left Arm Sensation  ?Patient states that she is not having left arm sensation that occurs intermittently.  She has noticed the over the course of the last 6 to 8 months.  She describes it as a tingling feeling that is not painful.  She denies any arm numbness.  She states that it does not impact her ability to function.  She has not experienced any weakness. ? ?Vitamin D ?Patient request have her vitamin D level checked.  She states that she takes supplements and has been told that her vitamin D levels were too high in the past. ? ?Concern for DM  ?Patient would also like to be checked for diabetes.  She reports that she recently lost her mother who had a history of diabetes. ? ?PERTINENT  PMH / PSH:  ?Traction Alopecia  ? ?OBJECTIVE:  ? ?BP (!) 158/67   Pulse 95   Wt 204 lb 3.2 oz (92.6 kg)   LMP 05/27/2016   SpO2 98%   BMI 35.05 kg/m?   ?General: female appearing stated age in no acute distress ?Cardio: Normal S1 and S2, no S3 or S4. Rhythm is regular. No murmurs or rubs.  Bilateral radial pulses palpable ?Pulm: Clear to auscultation bilaterally, no crackles, wheezing, or diminished breath sounds. Normal respiratory effort, stable on RA ?Abdomen: Bowel sounds normal. Abdomen soft and non-tender.  ?Extremities: No peripheral edema. Warm/ well perfused. No rashes noted on LUE  ?Neuro: pt alert and oriented x4, left arm with normal ROM, sensation intact to light touch throughout, 5/5 strength ? ? ?ASSESSMENT/PLAN:  ? ?High serum vitamin D ?Patient reported elevated vitamin D. Levels were within normal range in June of 2022 however patient takes high dose of supplement at home  ?Will check serum vitamin D level today per patient request  ? ?At risk of diabetes mellitus ?Patient reports mother with hx of DM  ?A1c ordered per patient request  ? ?Abnormal arm sensation ?Patient without red flag symptoms that would raise suspicion for stroke  ?Patient with  normal exam, reassuring no signs of shingles rash or weakness  ?Provided reassurance and counseling for changes to look for and return to care, patient voiced understanding  ?  ? ? ?Ronnald Ramp, MD ?[facility]  ?

## 2021-04-16 NOTE — Patient Instructions (Signed)
Today we will check your vitamin D levels as requested as well as your hemoglobin A1c.  I will notify you if any of the results are abnormal. ? ?You will not need a Pap smear until 2026 if you choose to continue having Pap smears. ? ?Please monitor your left arm symptoms and notify us or seek care immediately if you have persistent numbness or increasing pain or noticed any new rashes or blisters. ? ? ?

## 2021-04-17 DIAGNOSIS — R7989 Other specified abnormal findings of blood chemistry: Secondary | ICD-10-CM | POA: Insufficient documentation

## 2021-04-17 DIAGNOSIS — Z9189 Other specified personal risk factors, not elsewhere classified: Secondary | ICD-10-CM | POA: Insufficient documentation

## 2021-04-17 NOTE — Assessment & Plan Note (Addendum)
Patient without red flag symptoms that would raise suspicion for stroke  ?Patient with normal exam, reassuring no signs of shingles rash or weakness  ?Provided reassurance and counseling for changes to look for and return to care, patient voiced understanding  ?

## 2021-04-17 NOTE — Assessment & Plan Note (Signed)
Patient reports mother with hx of DM  ?A1c ordered per patient request  ?

## 2021-04-17 NOTE — Assessment & Plan Note (Signed)
Patient reported elevated vitamin D. Levels were within normal range in June of 2022 however patient takes high dose of supplement at home  ?Will check serum vitamin D level today per patient request  ?

## 2021-04-18 ENCOUNTER — Telehealth: Payer: Self-pay | Admitting: *Deleted

## 2021-04-18 NOTE — Telephone Encounter (Signed)
Pt LM on RN line, asking for callback to discuss lab results.  Attempted to callback but had to LMOVM for callback. Jone Baseman, CMA ? ?

## 2021-04-18 NOTE — Telephone Encounter (Signed)
Call patient at listed number.  No answer.  Left HIPAA compliant voicemail with report of prediabetes range hemoglobin A1c and notify patient that vitamin D levels are still pending. ? ?Ronnald Ramp, MD ?[facility], PGY-2 ?[phone]  ? ?

## 2021-04-25 LAB — VITAMIN D 1,25 DIHYDROXY
Vitamin D 1, 25 (OH)2 Total: 52 pg/mL
Vitamin D2 1, 25 (OH)2: <10 pg/mL
Vitamin D3 1, 25 (OH)2: 49 pg/mL

## 2021-04-25 LAB — HEMOGLOBIN A1C
Est. average glucose Bld gHb Est-mCnc: 120 mg/dL
Hgb A1c MFr Bld: 5.8 % — ABNORMAL HIGH (ref 4.8–5.6)

## 2021-07-02 ENCOUNTER — Encounter: Payer: Self-pay | Admitting: *Deleted

## 2021-07-24 ENCOUNTER — Other Ambulatory Visit: Payer: Self-pay | Admitting: Family Medicine

## 2021-07-24 DIAGNOSIS — Z1231 Encounter for screening mammogram for malignant neoplasm of breast: Secondary | ICD-10-CM

## 2021-08-16 ENCOUNTER — Ambulatory Visit: Payer: BLUE CROSS/BLUE SHIELD | Admitting: Family Medicine

## 2021-08-19 NOTE — Progress Notes (Deleted)
    SUBJECTIVE:   CHIEF COMPLAINT / HPI:   Reflux symptoms    PERTINENT  PMH / PSH: at risk for DM  OBJECTIVE:   LMP 05/27/2016   ***  ASSESSMENT/PLAN:   No problem-specific Assessment & Plan notes found for this encounter.     Levin Erp, MD [facility]

## 2021-08-20 ENCOUNTER — Ambulatory Visit: Payer: BLUE CROSS/BLUE SHIELD | Admitting: Student

## 2021-08-20 ENCOUNTER — Ambulatory Visit: Payer: BLUE CROSS/BLUE SHIELD

## 2021-08-22 IMAGING — MG MM DIGITAL SCREENING BILAT W/ TOMO AND CAD
6 of 10 series · 6 of 30 positions shown · non-contrast
Comparison: Previous exam(s).

CLINICAL DATA: Screening.

EXAM:
DIGITAL SCREENING BILATERAL MAMMOGRAM WITH TOMOSYNTHESIS AND CAD
TECHNIQUE: Bilateral screening digital craniocaudal and mediolateral oblique
mammograms were obtained. Bilateral screening digital breast
tomosynthesis was performed. The images were evaluated with
computer-aided detection.

[L CC synth-2D]
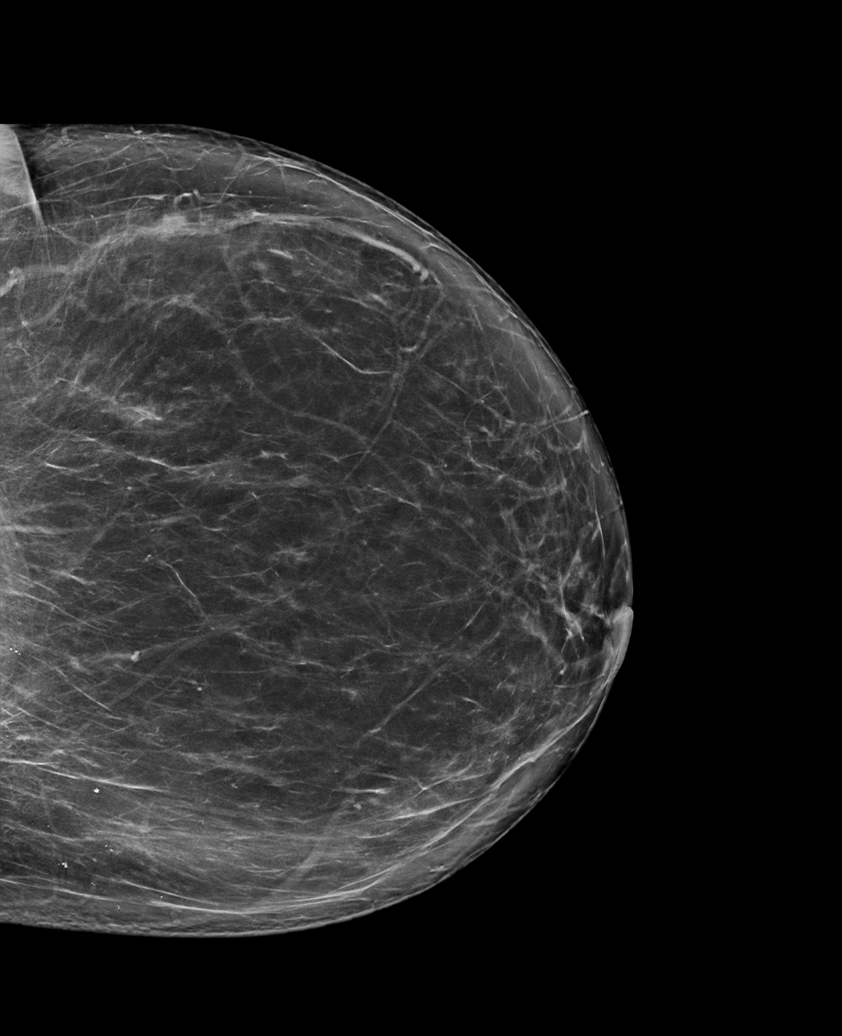

[R MLO synth-2D (1 of 2)]
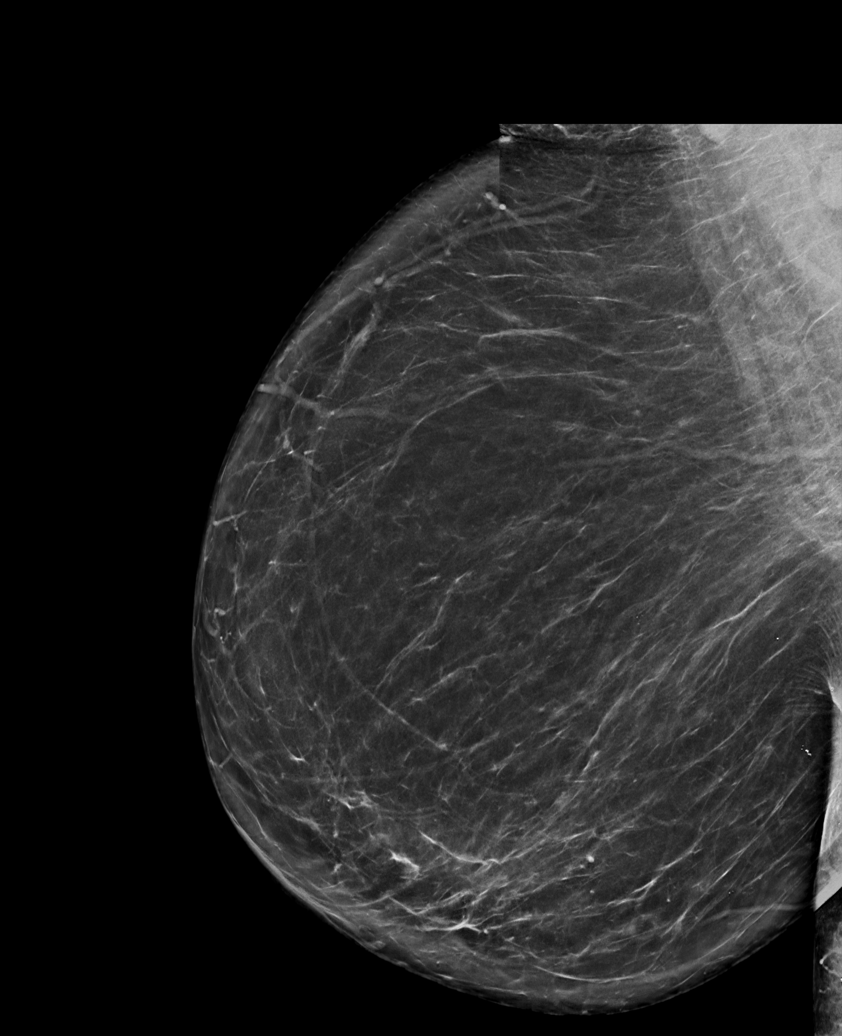

[R CC synth-2D]
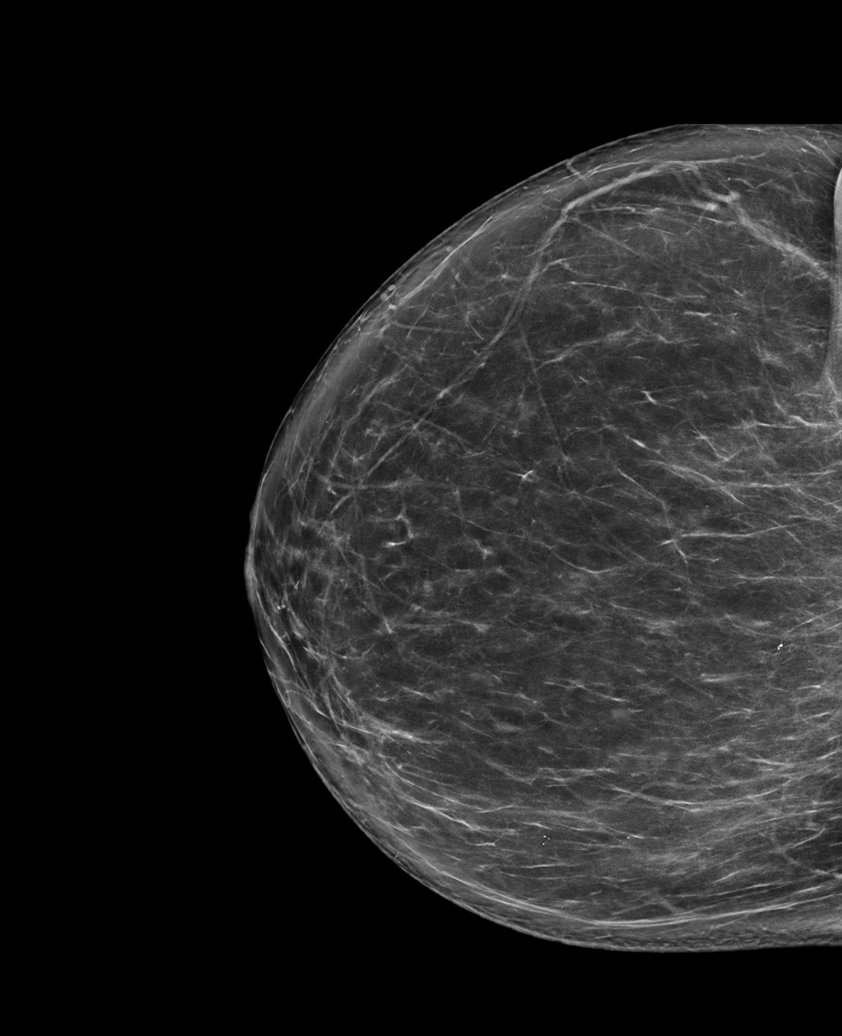

[R MLO synth-2D (2 of 2)]
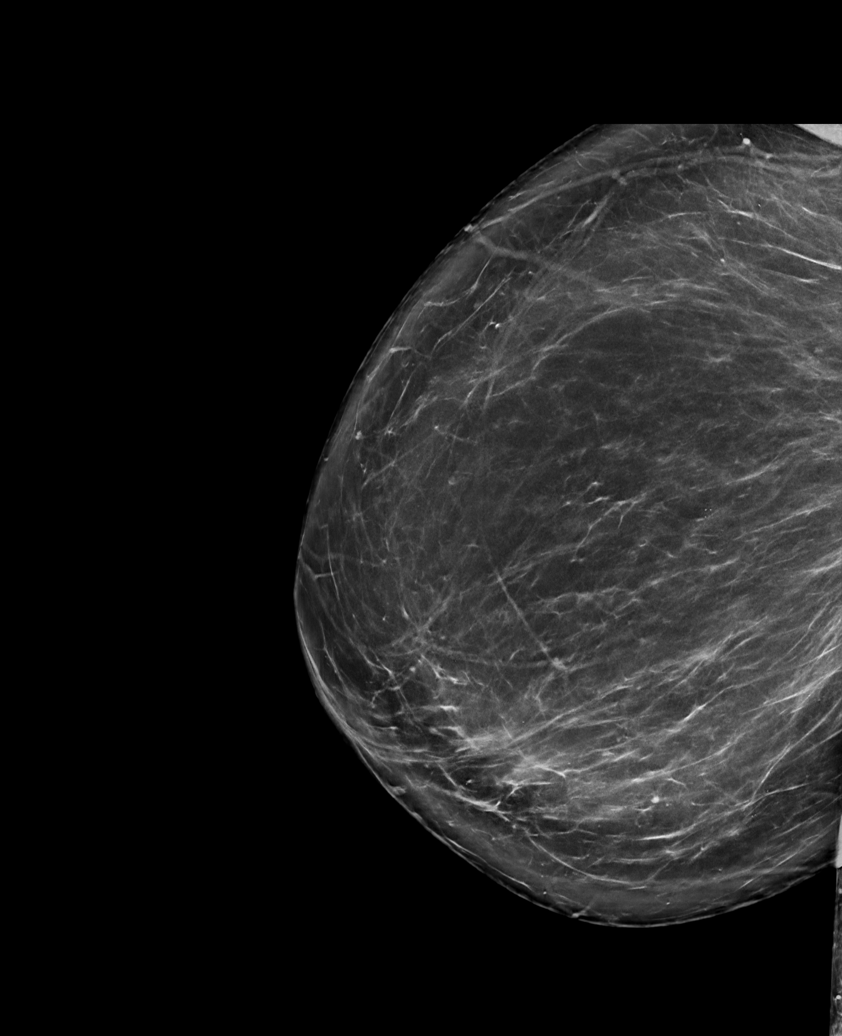

[L MLO synth-2D]
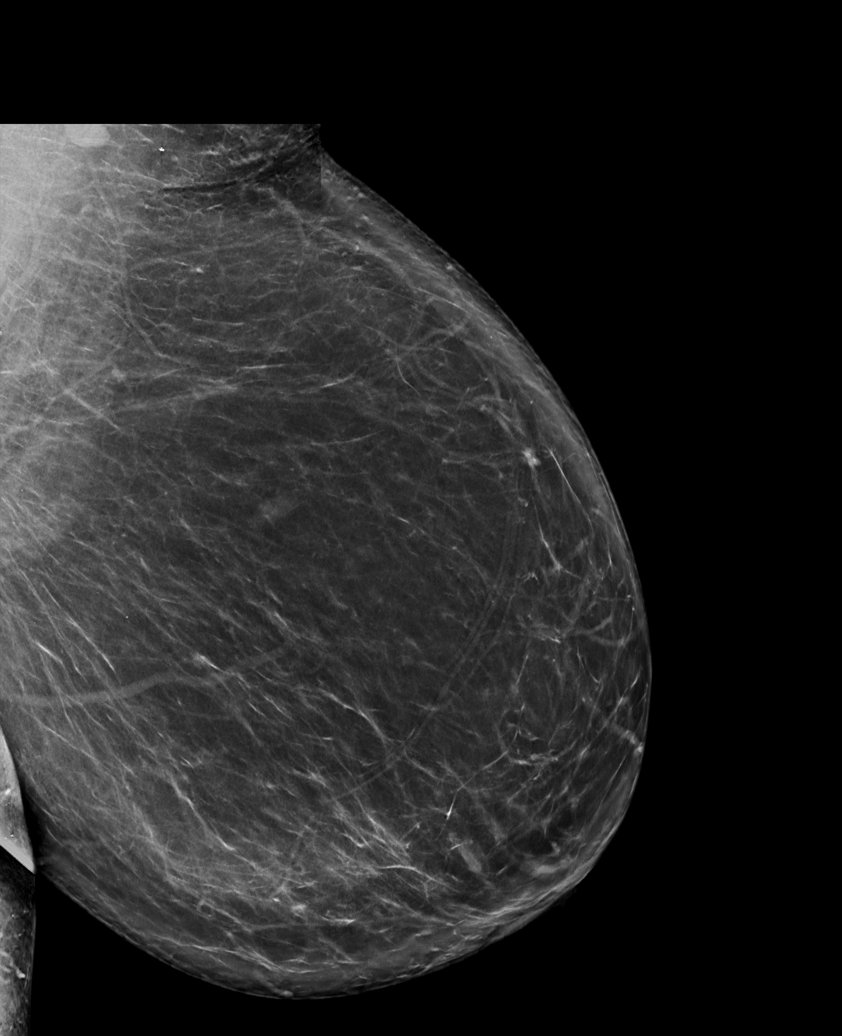

[L CC tomo · tomo slice 46/91.0]
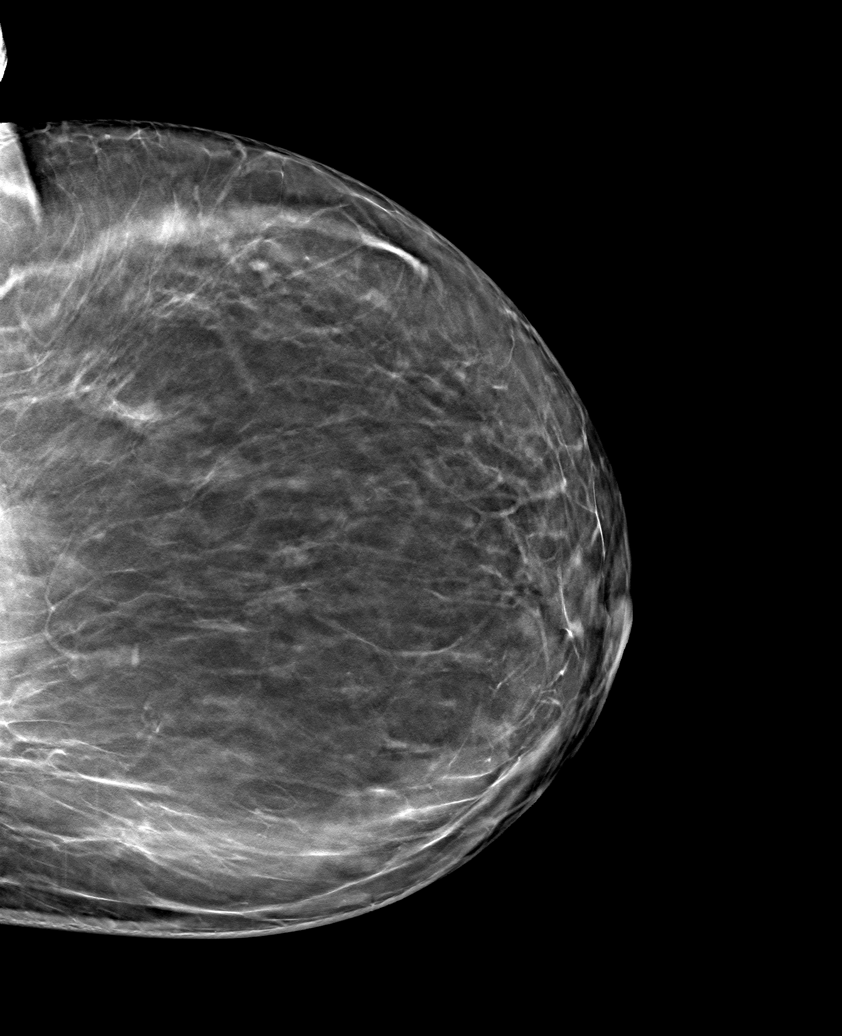

[6 of 30 positions shown; findings below may reference images not displayed]

ACR Breast Density Category b: There are scattered areas of
fibroglandular density.
FINDINGS: There are no findings suspicious for malignancy.
IMPRESSION: No mammographic evidence of malignancy. A result letter of this
screening mammogram will be mailed directly to the patient.

RECOMMENDATION:
Screening mammogram in one year. (Code:51-O-LD2)

BI-RADS CATEGORY  1: Negative.

## 2021-09-20 ENCOUNTER — Telehealth: Payer: Self-pay

## 2021-09-20 NOTE — Telephone Encounter (Signed)
Patient calls nurse line requesting to schedule appointment. Patient reports that she has been having itching all over her body and muscle spasms for the last two weeks. She also reports small tear on the inside of rectum from scratching. She denies rash, fever, difficulty breathing or swallowing.   Advised patient that we did not have any appointments today and the next available appointment would be on Monday. Given that patient has been having these symptoms for the last two weeks, scheduled patient for Monday and provided with ED precautions.   Veronda Prude, RN

## 2021-09-20 NOTE — Telephone Encounter (Signed)
Noted and agree.  Kamrin Spath, MD  Family Medicine Teaching Service   

## 2021-09-23 ENCOUNTER — Ambulatory Visit: Payer: BLUE CROSS/BLUE SHIELD | Admitting: Student

## 2021-09-23 VITALS — BP 124/68 | HR 79 | Wt 201.0 lb

## 2021-09-23 DIAGNOSIS — R202 Paresthesia of skin: Secondary | ICD-10-CM | POA: Insufficient documentation

## 2021-09-23 DIAGNOSIS — L290 Pruritus ani: Secondary | ICD-10-CM

## 2021-09-23 MED ORDER — HYDROCORTISONE (PERIANAL) 2.5 % EX CREA: 1.0000 | TOPICAL_CREAM | Freq: Two times a day (BID) | CUTANEOUS | 0 refills | Status: DC

## 2021-09-23 MED ORDER — HYDROXYZINE HCL 10 MG PO TABS: 10.0000 mg | ORAL_TABLET | Freq: Three times a day (TID) | ORAL | 0 refills | Status: DC | PRN

## 2021-09-23 NOTE — Patient Instructions (Signed)
It was great to see you! Thank you for allowing me to participate in your care!   Our plans for today:  -We will check some labs today and I will follow-up with this for you -Please apply Anusol on your rectum for itching -Please take hydroxyzine for itching you may take this 3 times daily as needed  Take care and seek immediate care sooner if you develop any concerns.  Levin Erp, MD

## 2021-09-23 NOTE — Assessment & Plan Note (Addendum)
Has pins-and-needles sensation of upper and lower extremity.  No weakness on examination. -CMP -CBC w diff -TSH -Hydroxyzine 10 mg 3 times daily as needed (help with itching and some anxious mood)

## 2021-09-23 NOTE — Progress Notes (Signed)
    SUBJECTIVE:   CHIEF COMPLAINT / HPI: Pins and needle sensation  Has been having pins and needle sensation all over body. Arms, legs, scalp. Feels like "sticking" inside of her.  Has been going on for 3 weeks.  Has had a history of cervical fusion surgery but denies any weakness.  She says that she has changed detergents recently but feels like the pins and needle sensation is coming from inside of her. No rash that she has seen in but she did notice some hypopigmentation on her body.  She has also been having significant itching of her rectum and has been itching it with a tissue.  Denies any blood in stool.  She says that it is tender there.  She says that 1 time in the past 3 weeks she had a sharp pain in her right upper abdomen but has not had any abdominal pain since then.  No other abdominal/GI symptoms.  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  She denies any smoking or alcohol.  Denies any sick symptoms like fevers.  Mother died recently from a cancer of her throat that was likely from her smoking.  PERTINENT  PMH / PSH: hx of abnormal sensation in arm, cervical fusion  OBJECTIVE:   BP 124/68   Pulse 79   Wt 201 lb (91.2 kg)   LMP 05/27/2016   SpO2 99%   BMI 34.50 kg/m   General: Well appearing, NAD, awake, alert, responsive to questions Head: Normocephalic atraumatic CV: Regular rate and rhythm no murmurs rubs or gallops Respiratory: Clear to ausculation bilaterally, no wheezes rales or crackles, chest rises symmetrically,  no increased work of breathing Abdomen: Soft, non-tender, non-distended, normoactive bowel sounds, negative Murphy sign Skin: Some hypopigmented spots on lower extremity    ASSESSMENT/PLAN:   Paresthesia Has pins-and-needles sensation of upper and lower extremity.  No weakness on examination. -CMP -CBC w diff -TSH -Hydroxyzine 10 mg 3 times daily as needed (help with itching and some anxious mood)   Pruritus ani -  hydrocortisone (ANUSOL-HC) 2.5 % rectal cream; Place 1 Application rectally 2 (two) times daily.  Dispense: 30 g; Refill: 0   Levin Erp, MD [facility]

## 2021-09-24 LAB — COMPREHENSIVE METABOLIC PANEL
ALT: 24 IU/L (ref 0–32)
AST: 26 IU/L (ref 0–40)
Albumin/Globulin Ratio: 1.7 (ref 1.2–2.2)
Albumin: 4.5 g/dL (ref 3.8–4.9)
Alkaline Phosphatase: 115 IU/L (ref 44–121)
BUN/Creatinine Ratio: 19 (ref 9–23)
BUN: 15 mg/dL (ref 6–24)
Bilirubin Total: 0.7 mg/dL (ref 0.0–1.2)
CO2: 23 mmol/L (ref 20–29)
Calcium: 9.6 mg/dL (ref 8.7–10.2)
Chloride: 100 mmol/L (ref 96–106)
Creatinine, Ser: 0.8 mg/dL (ref 0.57–1.00)
Globulin, Total: 2.7 g/dL (ref 1.5–4.5)
Glucose: 101 mg/dL — ABNORMAL HIGH (ref 70–99)
Potassium: 4.4 mmol/L (ref 3.5–5.2)
Sodium: 138 mmol/L (ref 134–144)
Total Protein: 7.2 g/dL (ref 6.0–8.5)
eGFR: 85 mL/min/{1.73_m2} (ref >59)

## 2021-09-24 LAB — CBC WITH DIFFERENTIAL/PLATELET
Basophils Absolute: 0 10*3/uL (ref 0.0–0.2)
Basos: 0 %
EOS (ABSOLUTE): 0.1 10*3/uL (ref 0.0–0.4)
Eos: 1 %
Hematocrit: 38.3 % (ref 34.0–46.6)
Hemoglobin: 13 g/dL (ref 11.1–15.9)
Immature Grans (Abs): 0 10*3/uL (ref 0.0–0.1)
Immature Granulocytes: 0 %
Lymphocytes Absolute: 3 10*3/uL (ref 0.7–3.1)
Lymphs: 33 %
MCH: 30.1 pg (ref 26.6–33.0)
MCHC: 33.9 g/dL (ref 31.5–35.7)
MCV: 89 fL (ref 79–97)
Monocytes Absolute: 0.8 10*3/uL (ref 0.1–0.9)
Monocytes: 8 %
Neutrophils Absolute: 5.2 10*3/uL (ref 1.4–7.0)
Neutrophils: 58 %
Platelets: 236 10*3/uL (ref 150–450)
RBC: 4.32 x10E6/uL (ref 3.77–5.28)
RDW: 12.3 % (ref 11.7–15.4)
WBC: 9.1 10*3/uL (ref 3.4–10.8)

## 2021-09-24 LAB — TSH: TSH: 1.76 u[IU]/mL (ref 0.450–4.500)

## 2021-11-18 ENCOUNTER — Other Ambulatory Visit: Payer: Self-pay | Admitting: Podiatry

## 2021-12-04 ENCOUNTER — Other Ambulatory Visit: Payer: Self-pay | Admitting: Student

## 2021-12-04 DIAGNOSIS — L290 Pruritus ani: Secondary | ICD-10-CM

## 2022-01-29 ENCOUNTER — Ambulatory Visit: Payer: Self-pay | Admitting: Podiatry

## 2022-01-30 ENCOUNTER — Ambulatory Visit: Payer: Self-pay | Admitting: Podiatry

## 2022-02-10 ENCOUNTER — Encounter: Payer: BLUE CROSS/BLUE SHIELD | Admitting: Family Medicine

## 2022-02-19 ENCOUNTER — Ambulatory Visit: Payer: 59 | Admitting: Podiatry

## 2022-02-20 ENCOUNTER — Encounter: Payer: Self-pay | Admitting: Family Medicine

## 2022-02-20 ENCOUNTER — Ambulatory Visit
Admission: RE | Admit: 2022-02-20 | Discharge: 2022-02-20 | Disposition: A | Discharge status: Home / Self Care | Payer: 59 | Source: Ambulatory Visit | Attending: Family Medicine | Admitting: Family Medicine

## 2022-02-20 ENCOUNTER — Ambulatory Visit (INDEPENDENT_AMBULATORY_CARE_PROVIDER_SITE_OTHER): Payer: 59 | Admitting: Family Medicine

## 2022-02-20 VITALS — BP 129/71 | HR 85 | Wt 205.0 lb

## 2022-02-20 DIAGNOSIS — M79604 Pain in right leg: Secondary | ICD-10-CM | POA: Diagnosis not present

## 2022-02-20 DIAGNOSIS — Z23 Encounter for immunization: Secondary | ICD-10-CM | POA: Diagnosis not present

## 2022-02-20 DIAGNOSIS — Z0000 Encounter for general adult medical examination without abnormal findings: Secondary | ICD-10-CM | POA: Diagnosis not present

## 2022-02-20 DIAGNOSIS — Z13220 Encounter for screening for lipoid disorders: Secondary | ICD-10-CM | POA: Diagnosis not present

## 2022-02-20 DIAGNOSIS — M79661 Pain in right lower leg: Secondary | ICD-10-CM | POA: Diagnosis not present

## 2022-02-20 MED ORDER — MELOXICAM 15 MG PO TABS: 15.0000 mg | ORAL_TABLET | Freq: Every day | ORAL | 0 refills | Status: DC

## 2022-02-20 NOTE — Progress Notes (Signed)
  Date of Visit: 02/20/2022   SUBJECTIVE:   HPI:  [name] presents today for a well woman exam.   Concerns today: Right shin pain, see below Periods: Postmenopausal Contraception: Postmenopausal Pelvic symptoms: None Sexual activity: Not sexually active STD Screening: Declines today Pap smear status: Current, last Pap 2 years ago Exercise: Working on being more active Diet: Trying to eat more healthy Smoking: No Alcohol: Just occasional Drugs: No Mood: Doing well no concerns  Right shin pain: Present for about a month.  Did not injure the area but just has noticed pain in the right lower leg when she walks.  Occasionally limps.  She would like an x-ray and is also requesting meloxicam, as she has taken it in the past with good results.  OBJECTIVE:   BP 129/71   Pulse 85   Wt 205 lb (93 kg)   LMP 05/27/2016   SpO2 97%   BMI 35.19 kg/m  Gen: NAD, pleasant, cooperative HEENT: NCAT, PERRL, no palpable thyromegaly or anterior cervical lymphadenopathy Heart: RRR, no murmurs Lungs: CTAB, NWOB Abdomen: soft, nontender to palpation Neuro: grossly nonfocal, speech normal Extremities: Bilateral extremities without swelling or erythema.  Mild tenderness to palpation on the anterior and lateral aspect of right lower leg.  No bruising or deformities palpable.  Ankle fully stable.  Full strength with ankle inversion/eversion/dorsiflexion/plantarflexion.  ASSESSMENT/PLAN:   Health maintenance:  -STD screening: Declines today -pap smear: Current, advised does not need another for another few years -mammogram: Up-to-date -lipid screening: Update lipid panel today along with CMET -colon cancer screening: Due for colonoscopy December 2024, she is aware -lung cancer screening: Not indicated, never smoker -immunizations:  Flu: Previously received Tdap: Booster given today Shingrix: Completed Shingrix course COVID: Up-to-date Pneumovax: Not indicated -handout given on health  maintenance topics  Right shin pain Unclear etiology on exam.  No gross deformities or preceding injuries.  Patient is requesting an x-ray as well as meloxicam.  I think this is reasonable.  Ordered both for her.  She will follow-up if it is not improving.  FOLLOW UP: Follow up in 1 year for next CPE, sooner if needed  [name], [place]

## 2022-02-20 NOTE — Patient Instructions (Signed)
It was great to see you again today.  Sent in meloxicam and ordered an xray for you If leg not getting better would recommend we send you to see an orthopedic specialist  Checking labs today - kidneys, liver, cholesterol  Updating tetanus shot today  Follow up with Dr. Owens Shark in 1 year, sooner if needed or if leg is not improving  Be well, Dr. Ardelia Mems  Health Maintenance, Female Adopting a healthy lifestyle and getting preventive care are important in promoting health and wellness. Ask your health care provider about: The right schedule for you to have regular tests and exams. Things you can do on your own to prevent diseases and keep yourself healthy. What should I know about diet, weight, and exercise? Eat a healthy diet  Eat a diet that includes plenty of vegetables, fruits, low-fat dairy products, and lean protein. Do not eat a lot of foods that are high in solid fats, added sugars, or sodium. Maintain a healthy weight Body mass index (BMI) is used to identify weight problems. It estimates body fat based on height and weight. Your health care provider can help determine your BMI and help you achieve or maintain a healthy weight. Get regular exercise Get regular exercise. This is one of the most important things you can do for your health. Most adults should: Exercise for at least 150 minutes each week. The exercise should increase your heart rate and make you sweat (moderate-intensity exercise). Do strengthening exercises at least twice a week. This is in addition to the moderate-intensity exercise. Spend less time sitting. Even light physical activity can be beneficial. Watch cholesterol and blood lipids Have your blood tested for lipids and cholesterol at 60 years of age, then have this test every 5 years. Have your cholesterol levels checked more often if: Your lipid or cholesterol levels are high. You are older than 60 years of age. You are at high risk for heart  disease. What should I know about cancer screening? Depending on your health history and family history, you may need to have cancer screening at various ages. This may include screening for: Breast cancer. Cervical cancer. Colorectal cancer. Skin cancer. Lung cancer. What should I know about heart disease, diabetes, and high blood pressure? Blood pressure and heart disease High blood pressure causes heart disease and increases the risk of stroke. This is more likely to develop in people who have high blood pressure readings or are overweight. Have your blood pressure checked: Every 3-5 years if you are 65-42 years of age. Every year if you are 71 years old or older. Diabetes Have regular diabetes screenings. This checks your fasting blood sugar level. Have the screening done: Once every three years after age 85 if you are at a normal weight and have a low risk for diabetes. More often and at a younger age if you are overweight or have a high risk for diabetes. What should I know about preventing infection? Hepatitis B If you have a higher risk for hepatitis B, you should be screened for this virus. Talk with your health care provider to find out if you are at risk for hepatitis B infection. Hepatitis C Testing is recommended for: Everyone born from [birth year] through 1963. Anyone with known risk factors for hepatitis C. Sexually transmitted infections (STIs) Get screened for STIs, including gonorrhea and chlamydia, if: You are sexually active and are younger than 60 years of age. You are older than 60 years of age and your health care  provider tells you that you are at risk for this type of infection. Your sexual activity has changed since you were last screened, and you are at increased risk for chlamydia or gonorrhea. Ask your health care provider if you are at risk. Ask your health care provider about whether you are at high risk for HIV. Your health care provider may recommend a  prescription medicine to help prevent HIV infection. If you choose to take medicine to prevent HIV, you should first get tested for HIV. You should then be tested every 3 months for as long as you are taking the medicine. Pregnancy If you are about to stop having your period (premenopausal) and you may become pregnant, seek counseling before you get pregnant. Take 400 to 800 micrograms (mcg) of folic acid every day if you become pregnant. Ask for birth control (contraception) if you want to prevent pregnancy. Osteoporosis and menopause Osteoporosis is a disease in which the bones lose minerals and strength with aging. This can result in bone fractures. If you are 27 years old or older, or if you are at risk for osteoporosis and fractures, ask your health care provider if you should: Be screened for bone loss. Take a calcium or vitamin D supplement to lower your risk of fractures. Be given hormone replacement therapy (HRT) to treat symptoms of menopause. Follow these instructions at home: Alcohol use Do not drink alcohol if: Your health care provider tells you not to drink. You are pregnant, may be pregnant, or are planning to become pregnant. If you drink alcohol: Limit how much you have to: 0-1 drink a day. Know how much alcohol is in your drink. In the [place] one drink equals one 12 oz bottle of beer (355 mL), one 5 oz glass of wine (148 mL), or one 1 oz glass of hard liquor (44 mL). Lifestyle Do not use any products that contain nicotine or tobacco. These products include cigarettes, chewing tobacco, and vaping devices, such as e-cigarettes. If you need help quitting, ask your health care provider. Do not use street drugs. Do not share needles. Ask your health care provider for help if you need support or information about quitting drugs. General instructions Schedule regular health, dental, and eye exams. Stay current with your vaccines. Tell your health care provider if: You often  feel depressed. You have ever been abused or do not feel safe at home. Summary Adopting a healthy lifestyle and getting preventive care are important in promoting health and wellness. Follow your health care provider's instructions about healthy diet, exercising, and getting tested or screened for diseases. Follow your health care provider's instructions on monitoring your cholesterol and blood pressure. This information is not intended to replace advice given to you by your health care provider. Make sure you discuss any questions you have with your health care provider. Document Revised: 06/04/2020 Document Reviewed: 06/04/2020 Elsevier Patient Education  [place].

## 2022-02-21 LAB — CMP14+EGFR
ALT: 15 IU/L (ref 0–32)
AST: 23 IU/L (ref 0–40)
Albumin/Globulin Ratio: 1.5 (ref 1.2–2.2)
Albumin: 4.5 g/dL (ref 3.8–4.9)
Alkaline Phosphatase: 109 IU/L (ref 44–121)
BUN/Creatinine Ratio: 15 (ref 9–23)
BUN: 13 mg/dL (ref 6–24)
Bilirubin Total: 0.7 mg/dL (ref 0.0–1.2)
CO2: 23 mmol/L (ref 20–29)
Calcium: 9.7 mg/dL (ref 8.7–10.2)
Chloride: 101 mmol/L (ref 96–106)
Creatinine, Ser: 0.89 mg/dL (ref 0.57–1.00)
Globulin, Total: 3 g/dL (ref 1.5–4.5)
Glucose: 94 mg/dL (ref 70–99)
Potassium: 4.3 mmol/L (ref 3.5–5.2)
Sodium: 141 mmol/L (ref 134–144)
Total Protein: 7.5 g/dL (ref 6.0–8.5)
eGFR: 75 mL/min/{1.73_m2} (ref >59)

## 2022-02-21 LAB — LIPID PANEL
Chol/HDL Ratio: 3.1 ratio (ref 0.0–4.4)
Cholesterol, Total: 175 mg/dL (ref 100–199)
HDL: 57 mg/dL (ref >39)
LDL Chol Calc (NIH): 104 mg/dL — ABNORMAL HIGH (ref 0–99)
Triglycerides: 75 mg/dL (ref 0–149)
VLDL Cholesterol Cal: 14 mg/dL (ref 5–40)

## 2022-03-07 ENCOUNTER — Telehealth: Payer: Self-pay

## 2022-03-07 DIAGNOSIS — M79604 Pain in right leg: Secondary | ICD-10-CM

## 2022-03-07 NOTE — Telephone Encounter (Signed)
Reasonable, referral entered. Leeanne Rio, MD

## 2022-03-07 NOTE — Telephone Encounter (Signed)
Patient calls nurse line requesting referral to orthopedic provider.   She reports that she was seen last week with Dr. Ardelia Mems for shin pain that has not improved with meloxicam. She is now requesting to see specialist for concern.   Will forward to Dr. Ardelia Mems.  Talbot Grumbling, RN

## 2022-03-18 ENCOUNTER — Telehealth: Payer: Self-pay | Admitting: Sports Medicine

## 2022-03-18 ENCOUNTER — Encounter: Payer: Self-pay | Admitting: Sports Medicine

## 2022-03-18 ENCOUNTER — Ambulatory Visit: Payer: Self-pay

## 2022-03-18 ENCOUNTER — Ambulatory Visit (INDEPENDENT_AMBULATORY_CARE_PROVIDER_SITE_OTHER): Payer: 59 | Admitting: Sports Medicine

## 2022-03-18 DIAGNOSIS — M79661 Pain in right lower leg: Secondary | ICD-10-CM | POA: Diagnosis not present

## 2022-03-18 DIAGNOSIS — R29898 Other symptoms and signs involving the musculoskeletal system: Secondary | ICD-10-CM | POA: Diagnosis not present

## 2022-03-18 MED ORDER — PREDNISONE 5 MG (21) PO TBPK: ORAL_TABLET | ORAL | 0 refills | Status: DC

## 2022-03-18 NOTE — Progress Notes (Signed)
Right lower leg/calf pain  Sudden onset after she went under her house and felt like she applied too much pressure to that leg  History of neck surgery

## 2022-03-18 NOTE — Telephone Encounter (Signed)
Patient called advised her insurance prefer that all [facility] for [facility] on [address]. The number to contact patient is [phone]

## 2022-03-18 NOTE — Progress Notes (Signed)
[name] - 61 y.o. female MRN [id number]  Date of birth: 1960-03-11  Office Visit Note: Visit Date: 03/18/2022 PCP: Martyn Malay, MD Referred by: Leeanne Rio, MD  Subjective: Chief Complaint  Patient presents with   Right Leg - Pain   HPI: [name] is a pleasant 62 y.o. female who presents today for right lower leg pain.  [name] has had right lower leg and calf pain for a little over a month.  She states she noticed the pain after she had to go under the house and felt [name] she was applying too much pressure to the right leg.  She does report a history of cervical spine fusion back in 2012.  States since this time, the entire right leg is always been somewhat weaker than the left.  She does [name] walking for exercise.  Review of family medicine note from 02/20/2022 showed that she had an x-ray and was prescribed meloxicam at that visit.  Patient states she took this for about 3-4 days but then stopped it as she did not notice improvement.  She has no chest pain or shortness of breath.  Denies any history of blood clot.  Pertinent ROS were reviewed with the patient and found to be negative unless otherwise specified above in HPI.   Assessment & Plan: Visit Diagnoses:  1. Pain in right lower leg   2. Right leg weakness    Plan: Discussed with [name] the possible etiologies of her leg pain.  X-ray is reassuring against fracture.  I think this is more of an overuse injury as she was compensating as chronically she has more weakness of the right leg compared to the left.  Ultrasound showed no tendon tearing but some inflammation within the soft tissue of the proximal calf.  Given this, we will shut down the leg simply for 2 weeks and a tall leg walking boot.  She can discontinue meloxicam, will do a 6-day prednisone taper.  We will reevaluate in 2 weeks how she was doing.  Hopefully get her out of the boot at that time.  If she has residual weakness or pain,  formalized physical therapy made benefit the patient for her pain as well as improving the symmetry in her gait.  Additional treatment options may be considering a trial of extracorporeal shockwave therapy.  Follow-up: Return in about 2 weeks (around 04/01/2022).   Meds & Orders:  Meds ordered this encounter  Medications   DISCONTD: predniSONE (STERAPRED UNI-PAK 21 TAB) 5 MG (21) TBPK tablet    Sig: Take per packet instructions.    Dispense:  1 each    Refill:  0   predniSONE (STERAPRED UNI-PAK 21 TAB) 5 MG (21) TBPK tablet    Sig: Take per packet instructions.    Dispense:  1 each    Refill:  0    Orders Placed This Encounter  Procedures   [place] Extrem Low Right Ltd     Procedures: No procedures performed      Clinical History: No specialty comments available.  She reports that she has never smoked. She has never used smokeless tobacco.  Recent Labs    04/16/21 1637  HGBA1C 5.8*    Objective:   Vital Signs: LMP 05/27/2016   Physical Exam  Gen: Well-appearing, in no acute distress; non-toxic CV: Well-perfused. Warm.  Resp: Breathing unlabored on room air; no wheezing. Psych: Fluid speech in conversation; appropriate affect; normal thought process Neuro: Sensation intact throughout. No  gross coordination deficits.   Ortho Exam - Right leg: There is no overlying swelling, redness or bruising.  No TTP palpating on the proximal to distal end of the anterior tibial bone.  There is some generalized TTP palpating over the calf musculature and the proximal aspect.  There is equivocal circumference of bilateral legs.  Negative Homans' sign.  Neurovascular intact distally.  Full range of motion at the knee and ankle.  There is weakness associated with hip flexion and knee extension of the right compared to the left side.  Imaging: [place] Extrem Low Right Ltd  Result Date: 03/18/2022 Limited musculoskeletal ultrasound of the right lower leg was performed today.  Evaluation of the  fibular head demonstrates no cortical irregularity.  Visualization of the peroneal nerve was seen without evidence of entrapment.  No cortical defects over the anterior tibia.  Evaluation in short and long axis of the posterior calf musculature shows intact gastrocnemius tendons.  There are some very mild hypoechoic change within the soft tissue of the proximal calf.  Mild hyperemia noted in this location.  Evaluation of the popliteal fossa demonstrates no Baker cyst or space-occupying lesion.   *Independent review of tibia and fibula x-ray, 2 views, from 02/20/2022 were independently reviewed and interpreted by myself.  No acute osseous abnormalities noted, no fracture noted.  There is likely an os trigonum that is an incidental finding.   [place] Extrem Low Right Ltd Limited musculoskeletal ultrasound of the right lower leg was performed  today.  Evaluation of the fibular head demonstrates no cortical  irregularity.  Visualization of the peroneal nerve was seen without  evidence of entrapment.  No cortical defects over the anterior tibia.   Evaluation in short and long axis of the posterior calf musculature shows  intact gastrocnemius tendons.  There are some very mild hypoechoic change  within the soft tissue of the proximal calf.  Mild hyperemia noted in this  location.  Evaluation of the popliteal fossa demonstrates no Baker cyst or  space-occupying lesion.   Past Medical/Family/Surgical/Social History: Medications & Allergies reviewed per EMR, new medications updated. 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   Healthcare maintenance 01/16/2020   Abnormal arm sensation 01/10/2010   Past Medical History:  Diagnosis Date   Environmental allergies    Family History  Problem Relation Age of Onset   Diabetes Mother    Hypertension Mother    Cancer  Mother        SCC of neck, follows with ENT    Cancer Maternal Grandmother        pancreas   Hypertension Maternal Grandmother    Colon cancer Neg Hx    Breast cancer Neg Hx    Past Surgical History:  Procedure Laterality Date   CERVICAL FUSION  2012   Social History   Occupational History   Not on file  Tobacco Use   Smoking status: Never   Smokeless tobacco: Never  Substance and Sexual Activity   Alcohol use: Yes    Alcohol/week: 4.0 standard drinks of alcohol    Types: 4 Glasses of wine per week    Comment: 1 glass wine    Drug use: No   Sexual activity: Not on file

## 2022-03-19 ENCOUNTER — Other Ambulatory Visit: Payer: Self-pay | Admitting: Family Medicine

## 2022-03-20 ENCOUNTER — Telehealth: Payer: Self-pay | Admitting: Sports Medicine

## 2022-03-20 NOTE — Telephone Encounter (Signed)
Patient called. Says she hasn't used the boot that was given to her. Would like to know if she should bring it back.

## 2022-03-21 ENCOUNTER — Telehealth: Payer: Self-pay

## 2022-03-21 NOTE — Telephone Encounter (Signed)
Patient called triage. She was seen in the office this week. She has a note to return back to work on Monday 03/24/22. She said that she does not finish her medication until Sunday and she is concerned that she will still not feel up to returning to work yet. She wants another note just in case she can't make in to work Monday. Please call patient at [phone]. If Dr.Brooks is okay with a new work note, she would like this sent to her MyChart.

## 2022-03-21 NOTE — Telephone Encounter (Signed)
Message sent to patient via Mychart regarding response.

## 2022-03-24 ENCOUNTER — Telehealth: Payer: Self-pay

## 2022-03-24 NOTE — Telephone Encounter (Signed)
Patient called back; spoke with her regarding this Informed her we cannot write another work note for her, and we cannot call in another Prednisone pak without being seen. She declined an appointment.

## 2022-03-24 NOTE — Telephone Encounter (Signed)
Left VM for patient to call back and ask for me.  We sent mychart message on Friday regarding this. We cannot write her out of work. She needs to be seen again if still having pain

## 2022-03-24 NOTE — Telephone Encounter (Signed)
Patient called and would like to know if she can have an oow note for today.  She also would like to know if she can have another steroid pack sent in.   She requested to pick up the note at the office and to be called when it is ready to be picked up

## 2022-03-26 ENCOUNTER — Telehealth: Payer: Self-pay | Admitting: Sports Medicine

## 2022-03-26 ENCOUNTER — Other Ambulatory Visit: Payer: Self-pay | Admitting: Sports Medicine

## 2022-03-26 MED ORDER — TRAMADOL HCL 50 MG PO TABS: 50.0000 mg | ORAL_TABLET | Freq: Four times a day (QID) | ORAL | 0 refills | Status: DC | PRN

## 2022-03-26 NOTE — Telephone Encounter (Signed)
Rx called in 

## 2022-03-26 NOTE — Telephone Encounter (Signed)
Pt called asking for stronger medication. Pt states the boot helps alittle but the medication is not helping and she has no more vacation days to take off work. Please send medication to [facility]. Pt phone number is [phone]

## 2022-03-31 ENCOUNTER — Telehealth: Payer: Self-pay | Admitting: *Deleted

## 2022-03-31 DIAGNOSIS — M79604 Pain in right leg: Secondary | ICD-10-CM

## 2022-03-31 NOTE — Telephone Encounter (Signed)
Left message for patient to call back.  She called on Thursday of last week about her prednisone not working and potentially wanting to be referred elsewhere.  She has since been contacted by [facility] about this and they prescribed her tramadol for the pain.   If patient calls back, I just want to know how to proceed.

## 2022-04-01 ENCOUNTER — Encounter: Payer: Self-pay | Admitting: Sports Medicine

## 2022-04-01 ENCOUNTER — Ambulatory Visit: Payer: 59 | Admitting: Sports Medicine

## 2022-04-01 ENCOUNTER — Telehealth: Payer: Self-pay | Admitting: Sports Medicine

## 2022-04-01 NOTE — Telephone Encounter (Signed)
Patient was here. Would like something called in for her for pain. Her call back number is [phone]

## 2022-04-01 NOTE — Telephone Encounter (Signed)
Patient called back and left message.  She missed her appt with ortho and would like to be referred to a different location ([place] area).  Will forward to MD.  [name]

## 2022-04-02 ENCOUNTER — Encounter: Payer: Self-pay | Admitting: *Deleted

## 2022-04-02 NOTE — Addendum Note (Signed)
Addended by: Leeanne Rio on: 04/02/2022 10:30 AM   Modules accepted: Orders

## 2022-04-02 NOTE — Telephone Encounter (Signed)
New referral entered Lauren Rio, MD

## 2022-04-04 DIAGNOSIS — M79604 Pain in right leg: Secondary | ICD-10-CM | POA: Diagnosis not present

## 2022-04-07 ENCOUNTER — Ambulatory Visit: Payer: 59 | Admitting: Sports Medicine

## 2022-07-14 ENCOUNTER — Other Ambulatory Visit: Payer: Self-pay | Admitting: Family Medicine

## 2022-07-14 DIAGNOSIS — Z1231 Encounter for screening mammogram for malignant neoplasm of breast: Secondary | ICD-10-CM

## 2022-08-28 DIAGNOSIS — Z5919 Other inadequate housing: Secondary | ICD-10-CM | POA: Diagnosis not present

## 2022-08-28 DIAGNOSIS — E669 Obesity, unspecified: Secondary | ICD-10-CM | POA: Diagnosis not present

## 2022-08-28 DIAGNOSIS — Z809 Family history of malignant neoplasm, unspecified: Secondary | ICD-10-CM | POA: Diagnosis not present

## 2022-08-28 DIAGNOSIS — N181 Chronic kidney disease, stage 1: Secondary | ICD-10-CM | POA: Diagnosis not present

## 2022-08-28 DIAGNOSIS — Z8249 Family history of ischemic heart disease and other diseases of the circulatory system: Secondary | ICD-10-CM | POA: Diagnosis not present

## 2022-08-28 DIAGNOSIS — Z6832 Body mass index (BMI) 32.0-32.9, adult: Secondary | ICD-10-CM | POA: Diagnosis not present

## 2022-08-28 DIAGNOSIS — Z833 Family history of diabetes mellitus: Secondary | ICD-10-CM | POA: Diagnosis not present

## 2022-09-26 ENCOUNTER — Ambulatory Visit: Payer: 59

## 2022-09-26 ENCOUNTER — Ambulatory Visit
Admission: RE | Admit: 2022-09-26 | Discharge: 2022-09-26 | Disposition: A | Discharge status: Home / Self Care | Payer: 59 | Source: Ambulatory Visit | Attending: Family Medicine | Admitting: Family Medicine

## 2022-09-26 DIAGNOSIS — Z1231 Encounter for screening mammogram for malignant neoplasm of breast: Secondary | ICD-10-CM | POA: Diagnosis not present

## 2022-09-30 ENCOUNTER — Encounter: Payer: Self-pay | Admitting: Family Medicine

## 2022-10-02 ENCOUNTER — Encounter: Payer: Self-pay | Admitting: Internal Medicine

## 2022-10-10 ENCOUNTER — Ambulatory Visit: Payer: 59 | Admitting: Family Medicine

## 2022-12-10 ENCOUNTER — Telehealth: Payer: Self-pay

## 2022-12-10 ENCOUNTER — Ambulatory Visit (AMBULATORY_SURGERY_CENTER): Payer: 59

## 2022-12-10 VITALS — Ht 64.0 in | Wt 200.0 lb

## 2022-12-10 DIAGNOSIS — Z1211 Encounter for screening for malignant neoplasm of colon: Secondary | ICD-10-CM

## 2022-12-10 NOTE — Telephone Encounter (Signed)
Called home phone to try and reach patient.  Left message on voice mail.

## 2022-12-10 NOTE — Telephone Encounter (Signed)
Reached patient and completed pre visit

## 2022-12-10 NOTE — Telephone Encounter (Signed)
No answer went to voice mail. Left message that I would call back in 5 min

## 2022-12-10 NOTE — Progress Notes (Signed)

## 2022-12-15 ENCOUNTER — Encounter: Payer: Self-pay | Admitting: Internal Medicine

## 2022-12-30 NOTE — Progress Notes (Unsigned)
[facility] History and Physical   Primary Care Physician:  Westley Chandler, MD   Reason for Procedure:   CRCA screening  Plan:    colonoscoopy     HPI: [name] is a 62 y.o. female w/ hx normal colonoscopy in 2014. She is here for a repeat screening exam.   Past Medical History:  Diagnosis Date   Allergy    Environmental allergies     Past Surgical History:  Procedure Laterality Date   CERVICAL FUSION  2012    Prior to Admission medications   Medication Sig Start Date End Date Taking? Authorizing Provider  traMADol (ULTRAM) 50 MG tablet Take 1 tablet (50 mg total) by mouth every 6 (six) hours as needed. Patient not taking: Reported on 12/10/2022 03/26/22   Madelyn Brunner, DO  hydrocortisone (ANUSOL-HC) 2.5 % rectal cream Apply to rectal area  up to twice daily- please schedules visit before next refill Patient not taking: Reported on 12/10/2022 12/04/21   Westley Chandler, MD  meloxicam (MOBIC) 15 MG tablet TAKE 1 TABLET(15 MG) BY MOUTH DAILY FOR 7 DAYS THEN DAILY AS NEEDED Patient not taking: Reported on 12/10/2022 03/19/22   Carney Living, MD  Multiple Vitamin (MULTIVITAMIN ADULT PO) Take by mouth.    [provider]  predniSONE (STERAPRED UNI-PAK 21 TAB) 5 MG (21) TBPK tablet Take per packet instructions. Patient not taking: Reported on 12/10/2022 03/18/22   Madelyn Brunner, DO    Current Outpatient Medications  Medication Sig Dispense Refill   traMADol (ULTRAM) 50 MG tablet Take 1 tablet (50 mg total) by mouth every 6 (six) hours as needed. (Patient not taking: Reported on 12/10/2022) 20 tablet 0   hydrocortisone (ANUSOL-HC) 2.5 % rectal cream Apply to rectal area  up to twice daily- please schedules visit before next refill (Patient not taking: Reported on 12/10/2022) 30 g 0   meloxicam (MOBIC) 15 MG tablet TAKE 1 TABLET(15 MG) BY MOUTH DAILY FOR 7 DAYS THEN DAILY AS NEEDED (Patient not taking: Reported on 12/10/2022) 30 tablet 0    Multiple Vitamin (MULTIVITAMIN ADULT PO) Take by mouth.     predniSONE (STERAPRED UNI-PAK 21 TAB) 5 MG (21) TBPK tablet Take per packet instructions. (Patient not taking: Reported on 12/10/2022) 1 each 0   No current facility-administered medications for this visit.    Allergies as of 12/31/2022   (No Known Allergies)    Family History  Problem Relation Age of Onset   Diabetes Mother    Hypertension Mother    Cancer Mother        SCC of neck, follows with ENT    Cancer Maternal Grandmother        pancreas   Hypertension Maternal Grandmother    Colon cancer Neg Hx    Breast cancer Neg Hx    Colon polyps Neg Hx    Esophageal cancer Neg Hx    Rectal cancer Neg Hx    Stomach cancer Neg Hx     Social History   Socioeconomic History   Marital status: Single    Spouse name: Not on file   Number of children: Not on file   Years of education: Not on file   Highest education level: Not on file  Occupational History   Not on file  Tobacco Use   Smoking status: Never   Smokeless tobacco: Never  Substance and Sexual Activity   Alcohol use: Yes    Alcohol/week: 4.0 standard drinks of alcohol  Types: 4 Glasses of wine per week    Comment: 1 glass wine    Drug use: No   Sexual activity: Not on file  Other Topics Concern   Not on file  Social History Narrative   Never married   Cares for mother    Social Determinants of Health   Financial Resource Strain: Not on file  Food Insecurity: Not on file  Transportation Needs: Not on file  Physical Activity: Not on file  Stress: Not on file  Social Connections: Not on file  Intimate Partner Violence: Not on file    Review of Systems: Positive for *** All other review of systems negative except as mentioned in the HPI.  Physical Exam: Vital signs LMP 05/27/2016   General:   Alert,  Well-developed, well-nourished, pleasant and cooperative in NAD Lungs:  Clear throughout to auscultation.   Heart:  Regular rate and  rhythm; no murmurs, clicks, rubs,  or gallops. Abdomen:  Soft, nontender and nondistended. Normal bowel sounds.   Neuro/Psych:  Alert and cooperative. Normal mood and affect. A and O x 3   @Luda Charbonneau  Sena Slate, MD, [facility] [phone] (pager) 12/30/2022 6:13 PM@

## 2022-12-31 ENCOUNTER — Ambulatory Visit (AMBULATORY_SURGERY_CENTER): Payer: 59 | Admitting: Internal Medicine

## 2022-12-31 ENCOUNTER — Encounter: Payer: Self-pay | Admitting: Internal Medicine

## 2022-12-31 VITALS — BP 142/58 | HR 64 | Temp 97.0°F | Resp 9 | Ht 64.0 in | Wt 200.0 lb

## 2022-12-31 DIAGNOSIS — Z1211 Encounter for screening for malignant neoplasm of colon: Secondary | ICD-10-CM | POA: Diagnosis not present

## 2022-12-31 MED ORDER — SODIUM CHLORIDE 0.9 % IV SOLN: 500.0000 mL | Freq: Once | INTRAVENOUS | Status: DC

## 2022-12-31 NOTE — Progress Notes (Signed)
Pt's states no medical or surgical changes since previsit or office visit. 

## 2022-12-31 NOTE — Patient Instructions (Addendum)
Normal exam again - no polyps or cancer seen!  Next routine colonoscopy or other screening test in 10 years - 2034.  I appreciate the opportunity to care for you. Iva Boop, MD, FACG   YOU HAD AN ENDOSCOPIC PROCEDURE TODAY AT THE [facility]:   Refer to the procedure report that was given to you for any specific questions about what was found during the examination.  If the procedure report does not answer your questions, please call your gastroenterologist to clarify.  If you requested that your care partner not be given the details of your procedure findings, then the procedure report has been included in a sealed envelope for you to review at your convenience later.  YOU SHOULD EXPECT: Some feelings of bloating in the abdomen. Passage of more gas than usual.  Walking can help get rid of the air that was put into your GI tract during the procedure and reduce the bloating. If you had a lower endoscopy (such as a colonoscopy or flexible sigmoidoscopy) you may notice spotting of blood in your stool or on the toilet paper. If you underwent a bowel prep for your procedure, you may not have a normal bowel movement for a few days.  Please Note:  You might notice some irritation and congestion in your nose or some drainage.  This is from the oxygen used during your procedure.  There is no need for concern and it should clear up in a day or so.  SYMPTOMS TO REPORT IMMEDIATELY:  Following lower endoscopy (colonoscopy or flexible sigmoidoscopy):  Excessive amounts of blood in the stool  Significant tenderness or worsening of abdominal pains  Swelling of the abdomen that is new, acute  Fever of 100F or higher  For urgent or emergent issues, a gastroenterologist can be reached at any hour by calling [phone]. Do not use MyChart messaging for urgent concerns.    DIET:  We do recommend a small meal at first, but then you may proceed to your regular diet.  Drink plenty of fluids  but you should avoid alcoholic beverages for 24 hours.  ACTIVITY:  You should plan to take it easy for the rest of today and you should NOT DRIVE or use heavy machinery until tomorrow (because of the sedation medicines used during the test).    FOLLOW UP: Our staff will call the number listed on your records the next business day following your procedure.  We will call around 7:15- 8:00 am to check on you and address any questions or concerns that you may have regarding the information given to you following your procedure. If we do not reach you, we will leave a message.     If any biopsies were taken you will be contacted by phone or by letter within the next 1-3 weeks.  Please call us at [phone] if you have not heard about the biopsies in 3 weeks.    SIGNATURES/CONFIDENTIALITY: You and/or your care partner have signed paperwork which will be entered into your electronic medical record.  These signatures attest to the fact that that the information above on your After Visit Summary has been reviewed and is understood.  Full responsibility of the confidentiality of this discharge information lies with you and/or your care-partner.

## 2022-12-31 NOTE — Progress Notes (Signed)
Sedate, gd SR, tolerated procedure well, VSS, report to RN 

## 2022-12-31 NOTE — Op Note (Signed)
[facility] Patient Name: [name] Procedure Date: 12/31/2022 11:27 AM MRN: [id number] Endoscopist: Iva Boop , MD, [id number] Age: 62 Referring MD:  Date of Birth: 1961-01-19 Gender: Female Account #: [id number] Procedure:                Colonoscopy Indications:              Screening for colorectal malignant neoplasm, Last                            colonoscopy: 2014 Medicines:                Monitored Anesthesia Care Procedure:                Pre-Anesthesia Assessment:                           - Prior to the procedure, a History and Physical                            was performed, and patient medications and                            allergies were reviewed. The patient's tolerance of                            previous anesthesia was also reviewed. The risks                            and benefits of the procedure and the sedation                            options and risks were discussed with the patient.                            All questions were answered, and informed consent                            was obtained. Prior Anticoagulants: The patient has                            taken no anticoagulant or antiplatelet agents. ASA                            Grade Assessment: I - A normal, healthy patient.                            After reviewing the risks and benefits, the patient                            was deemed in satisfactory condition to undergo the                            procedure.  After obtaining informed consent, the colonoscope                            was passed under direct vision. Throughout the                            procedure, the patient's blood pressure, pulse, and                            oxygen saturations were monitored continuously. The                            Olympus Scope [id number] was introduced through the                            anus and advanced to the the cecum, identified by                             appendiceal orifice and ileocecal valve. The                            colonoscopy was performed without difficulty. The                            patient tolerated the procedure well. The quality                            of the bowel preparation was good. The appendiceal                            orifice and the rectum were photographed. The bowel                            preparation used was Miralax via split dose                            instruction. Scope In: 11:44:36 AM Scope Out: 11:57:08 AM Scope Withdrawal Time: 0 hours 9 minutes 22 seconds  Total Procedure Duration: 0 hours 12 minutes 32 seconds  Findings:                 The perianal and digital rectal examinations were                            normal.                           The entire examined colon appeared normal on direct                            and retroflexion views. Complications:            No immediate complications. Estimated Blood Loss:     Estimated blood loss: none. Impression:               - The entire examined colon  is normal on direct and                            retroflexion views.                           - No specimens collected. Recommendation:           - Patient has a contact number available for                            emergencies. The signs and symptoms of potential                            delayed complications were discussed with the                            patient. Return to normal activities tomorrow.                            Written discharge instructions were provided to the                            patient.                           - Resume previous diet.                           - Continue present medications.                           - Repeat colonoscopy in 10 years for screening                            purposes. Iva Boop, MD 12/31/2022 12:02:06 PM This report has been signed electronically.

## 2023-01-01 ENCOUNTER — Telehealth: Payer: Self-pay

## 2023-01-01 NOTE — Telephone Encounter (Signed)
LMOM

## 2023-02-27 DIAGNOSIS — Z833 Family history of diabetes mellitus: Secondary | ICD-10-CM | POA: Diagnosis not present

## 2023-02-27 DIAGNOSIS — Z6834 Body mass index (BMI) 34.0-34.9, adult: Secondary | ICD-10-CM | POA: Diagnosis not present

## 2023-02-27 DIAGNOSIS — Z8249 Family history of ischemic heart disease and other diseases of the circulatory system: Secondary | ICD-10-CM | POA: Diagnosis not present

## 2023-02-27 DIAGNOSIS — N182 Chronic kidney disease, stage 2 (mild): Secondary | ICD-10-CM | POA: Diagnosis not present

## 2023-02-27 DIAGNOSIS — Z809 Family history of malignant neoplasm, unspecified: Secondary | ICD-10-CM | POA: Diagnosis not present

## 2023-02-27 DIAGNOSIS — E669 Obesity, unspecified: Secondary | ICD-10-CM | POA: Diagnosis not present

## 2023-04-01 DIAGNOSIS — M2241 Chondromalacia patellae, right knee: Secondary | ICD-10-CM | POA: Diagnosis not present

## 2023-07-16 ENCOUNTER — Ambulatory Visit: Payer: Self-pay | Admitting: Student

## 2023-07-16 ENCOUNTER — Ambulatory Visit

## 2023-07-16 VITALS — BP 140/80 | HR 80 | Wt 204.5 lb

## 2023-07-16 DIAGNOSIS — Z13228 Encounter for screening for other metabolic disorders: Secondary | ICD-10-CM

## 2023-07-16 DIAGNOSIS — R350 Frequency of micturition: Secondary | ICD-10-CM | POA: Diagnosis not present

## 2023-07-16 DIAGNOSIS — R030 Elevated blood-pressure reading, without diagnosis of hypertension: Secondary | ICD-10-CM | POA: Diagnosis not present

## 2023-07-16 LAB — POCT GLYCOSYLATED HEMOGLOBIN (HGB A1C): Hemoglobin A1C: 5.7 % — AB (ref 4.0–5.6)

## 2023-07-16 LAB — POCT URINALYSIS DIPSTICK
Bilirubin, UA: NEGATIVE
Glucose, UA: NEGATIVE
Ketones, UA: NEGATIVE
Leukocytes, UA: NEGATIVE
Nitrite, UA: NEGATIVE
Protein, UA: NEGATIVE
Spec Grav, UA: 1.01 (ref 1.010–1.025)
Urobilinogen, UA: 0.2 U/dL
pH, UA: 5.5 (ref 5.0–8.0)

## 2023-07-16 NOTE — Progress Notes (Signed)
    SUBJECTIVE:   CHIEF COMPLAINT / HPI:   Discussed the use of AI scribe software for clinical note transcription with the patient, who gave verbal consent to proceed.  History of Present Illness [name] is a 63 year old female who presents for routine blood work and health maintenance.  She is uneasy about her health following her mother's death from cancer, which was related to smoking (head/neck cancer). This has led her to undergo blood work every six months. Her blood pressure was recently 144 mmHg, higher than usual, which she attributes to rushing in the morning. She acknowledges the need for weight loss and increased physical activity, mentioning an exercise bike at home.  She is concerned about her vitamin D  levels, which were low in the past, and has experienced some hair thinning. She is unsure about the appropriate dosage of vitamin D  and is worried about taking too much. She drinks a lot of water and is trying to increase her intake to improve kidney function.  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 She denies significant symptoms such as night sweats, significant weight loss, or respiratory symptoms.   Her family history includes her mother having neck cancer related to smoking and her grandmother having stomach cancer. She is unaware of her father's family history. She is up to date with her mammogram, Pap smear, and colonoscopy. She is proactive about her health and wants to ensure she is doing everything she can to maintain it.   PERTINENT  PMH / PSH: reviewed  OBJECTIVE:   BP (!) 140/80   Pulse 80   Wt 204 lb 8 oz (92.8 kg)   LMP 05/27/2016   SpO2 96%   BMI 35.10 kg/m   General: Well appearing, NAD, awake, alert, responsive to questions Head: Normocephalic atraumatic CV: Regular rate and rhythm no murmurs rubs or gallops Respiratory: Clear to ausculation  bilaterally, no wheezes rales or crackles, chest rises symmetrically,  no increased work of breathing Abdomen: Soft, non-tender, non-distended, normoactive bowel sounds  Extremities: Moves upper and lower extremities freely, no edema in LE Neuro: No focal deficits  ASSESSMENT/PLAN:   Assessment & Plan Elevated blood pressure reading in office without diagnosis of hypertension Patient's blood pressure is controlled today but borderline. BP: (!) 140/80. Goal of 140/90. Patient's medication regimen includes nothing. -2 week follow up for recheck -CMP Encounter for screening for other metabolic disorders Proactive with health maintenance, concerned about cancer due to family history. Up to date on screenings, discussed cancer detection and lifestyle modifications. A1c in prediabetic range - Order basic labs: blood counts, cholesterol, kidney function - Encourage increased physical activity, consider exercise bike. - Advised normal vit D tablet 5000 international units daily - Advise continuation of regular health screenings. Urinary frequency States she drinks a lot of water and possibly urinating more but wants to be sure of no infection. No dysuria, CVA tenderness, urgency. UA not infected appearing.  -return if any symptoms occur   Genora Kidd, MD [facility]

## 2023-07-16 NOTE — Patient Instructions (Addendum)
 It was great to see you! Thank you for allowing me to participate in your care!   Our plans for today:  - 2 week follow up for blood pressure recheck - I will let you know what labs show  Take care and seek immediate care sooner if you develop any concerns.  Genora Kidd, MD

## 2023-07-17 LAB — COMPREHENSIVE METABOLIC PANEL WITH GFR
ALT: 24 IU/L (ref 0–32)
AST: 23 IU/L (ref 0–40)
Albumin: 4.2 g/dL (ref 3.8–4.9)
Alkaline Phosphatase: 108 IU/L (ref 44–121)
BUN/Creatinine Ratio: 20 (ref 12–28)
BUN: 16 mg/dL (ref 8–27)
Bilirubin Total: 0.7 mg/dL (ref 0.0–1.2)
CO2: 22 mmol/L (ref 20–29)
Calcium: 9.9 mg/dL (ref 8.7–10.3)
Chloride: 100 mmol/L (ref 96–106)
Creatinine, Ser: 0.79 mg/dL (ref 0.57–1.00)
Globulin, Total: 2.7 g/dL (ref 1.5–4.5)
Glucose: 106 mg/dL — ABNORMAL HIGH (ref 70–99)
Potassium: 4.5 mmol/L (ref 3.5–5.2)
Sodium: 138 mmol/L (ref 134–144)
Total Protein: 6.9 g/dL (ref 6.0–8.5)
eGFR: 86 mL/min/{1.73_m2} (ref >59)

## 2023-07-17 LAB — CBC WITH DIFFERENTIAL/PLATELET
Basophils Absolute: 0 10*3/uL (ref 0.0–0.2)
Basos: 1 %
EOS (ABSOLUTE): 0.1 10*3/uL (ref 0.0–0.4)
Eos: 1 %
Hematocrit: 41.4 % (ref 34.0–46.6)
Hemoglobin: 13.3 g/dL (ref 11.1–15.9)
Immature Grans (Abs): 0 10*3/uL (ref 0.0–0.1)
Immature Granulocytes: 0 %
Lymphocytes Absolute: 2.5 10*3/uL (ref 0.7–3.1)
Lymphs: 37 %
MCH: 29.6 pg (ref 26.6–33.0)
MCHC: 32.1 g/dL (ref 31.5–35.7)
MCV: 92 fL (ref 79–97)
Monocytes Absolute: 0.6 10*3/uL (ref 0.1–0.9)
Monocytes: 8 %
Neutrophils Absolute: 3.6 10*3/uL (ref 1.4–7.0)
Neutrophils: 53 %
Platelets: 221 10*3/uL (ref 150–450)
RBC: 4.5 x10E6/uL (ref 3.77–5.28)
RDW: 12.2 % (ref 11.7–15.4)
WBC: 6.9 10*3/uL (ref 3.4–10.8)

## 2023-07-17 LAB — LIPID PANEL
Chol/HDL Ratio: 3.3 ratio (ref 0.0–4.4)
Cholesterol, Total: 195 mg/dL (ref 100–199)
HDL: 60 mg/dL (ref >39)
LDL Chol Calc (NIH): 118 mg/dL — ABNORMAL HIGH (ref 0–99)
Triglycerides: 95 mg/dL (ref 0–149)
VLDL Cholesterol Cal: 17 mg/dL (ref 5–40)

## 2023-07-17 LAB — TSH RFX ON ABNORMAL TO FREE T4: TSH: 1.48 u[IU]/mL (ref 0.450–4.500)

## 2023-07-17 NOTE — Telephone Encounter (Signed)
 Patient calls nurse line requesting recent labs results.  Discussed mildly elevated lipid panel and discussed dietary changes.   Discussed other normal results.   All questions answered.

## 2023-08-26 ENCOUNTER — Other Ambulatory Visit: Payer: Self-pay | Admitting: Family Medicine

## 2023-08-26 DIAGNOSIS — Z1231 Encounter for screening mammogram for malignant neoplasm of breast: Secondary | ICD-10-CM

## 2023-09-21 ENCOUNTER — Ambulatory Visit (INDEPENDENT_AMBULATORY_CARE_PROVIDER_SITE_OTHER): Admitting: Family Medicine

## 2023-09-21 VITALS — BP 139/83 | HR 86 | Ht 64.0 in | Wt 198.6 lb

## 2023-09-21 DIAGNOSIS — K644 Residual hemorrhoidal skin tags: Secondary | ICD-10-CM | POA: Diagnosis not present

## 2023-09-21 DIAGNOSIS — L299 Pruritus, unspecified: Secondary | ICD-10-CM | POA: Diagnosis not present

## 2023-09-21 DIAGNOSIS — R5383 Other fatigue: Secondary | ICD-10-CM

## 2023-09-21 LAB — POC SOFIA SARS ANTIGEN FIA: SARS Coronavirus 2 Ag: NEGATIVE

## 2023-09-21 MED ORDER — HYDROXYZINE HCL 10 MG PO TABS: 10.0000 mg | ORAL_TABLET | Freq: Three times a day (TID) | ORAL | 0 refills | Status: DC | PRN

## 2023-09-21 NOTE — Patient Instructions (Signed)
 We collected labs today. I will keep you up to date.  I have sent in hydroxyzine  to take for the itchy sensation. Be careful with this medication, as it can make you drowsy and may increase the risk of falls. Be sure to use some Eucerin or aquaphor on your skin to keep it moisturized.  Come back in 1 month to see how things are going or sooner if needed.

## 2023-09-21 NOTE — Progress Notes (Unsigned)
    SUBJECTIVE:   CHIEF COMPLAINT / HPI:   Headaches, prinkly sensation over the body, fatigue Has been going on for 1 month. Would like to get tested for COVID-19 since she feels this could be due to this. She works with seniors and helps them, but she has been using pads to help move them, so she does not think this is back pain. No new medications. No fevers, n/v, SOB. Sometimes she will have some itchy throat. No history of allergies but she does have some sinus problems every so often. No known sick contacts. No rash. The prinkliness also sometimes feels itchy.  Anal pain Itching and some pain around the anal area when she wipes that has also been going on for a month. No constipation. Colonoscopy last December. No blood in the stool.  PERTINENT  PMH / PSH: Paresthesias, traction alopecia  OBJECTIVE:   BP 139/83   Pulse 86   Ht 5' 4 (1.626 m)   Wt 198 lb 9.6 oz (90.1 kg)   LMP 05/27/2016   SpO2 100%   BMI 34.09 kg/m   General: Alert and oriented, in NAD Skin: Warm, dry, and intact without lesions HEENT: NCAT, EOM grossly normal, midline nasal septum Cardiac: RRR, no m/r/g appreciated Respiratory: CTAB, breathing and speaking comfortably on RA Abdominal: Soft, nontender, nondistended, normoactive bowel sounds Genitourinary: External anus examined without noticeable fissures or lesions, multiple anal skin tags noted, chaperone CMA Blount present Extremities: Moves all extremities grossly equally Neurological: No gross focal deficit Psychiatric: Appropriate mood and affect   ASSESSMENT/PLAN:   Assessment & Plan Generalized pruritus Unclear etiology, though xerosis is likely contributing.  Reassuringly without clear rash.  We will further evaluate with CBC, CMP, TSH, and iron panel.  Recommended good moisturization of entire body.  Will also send in low-dose hydroxyzine  to be taken as needed for itching; counseled on side effects at her age.  Return in 1 month or sooner if  needed. Other fatigue Vitals reassuring.  We are continuing to work up hematologic and metabolic causes as above.  Will also test for COVID-19 at patient's strong request.  Follow-up in 1 month or sooner if needed. Anal skin tag As appreciated on exam.  Likely contributing to itchiness around the anus.  Interestingly, recent colonoscopy did not mention these findings on the external or digital rectal exam.  Continue hydroxyzine  and workup for pruritus as above.  Can consider general surgical referral for removal, as desired.   Stuart Redo, MD [facility]

## 2023-09-22 ENCOUNTER — Ambulatory Visit: Payer: Self-pay | Admitting: Family Medicine

## 2023-09-22 DIAGNOSIS — Z1321 Encounter for screening for nutritional disorder: Secondary | ICD-10-CM

## 2023-09-22 LAB — COMPREHENSIVE METABOLIC PANEL WITH GFR
ALT: 14 IU/L (ref 0–32)
AST: 15 IU/L (ref 0–40)
Albumin: 4.4 g/dL (ref 3.8–4.9)
Alkaline Phosphatase: 96 IU/L (ref 44–121)
BUN/Creatinine Ratio: 15 (ref 12–28)
BUN: 12 mg/dL (ref 8–27)
Bilirubin Total: 0.5 mg/dL (ref 0.0–1.2)
CO2: 19 mmol/L — ABNORMAL LOW (ref 20–29)
Calcium: 9.4 mg/dL (ref 8.7–10.3)
Chloride: 103 mmol/L (ref 96–106)
Creatinine, Ser: 0.82 mg/dL (ref 0.57–1.00)
Globulin, Total: 2.7 g/dL (ref 1.5–4.5)
Glucose: 91 mg/dL (ref 70–99)
Potassium: 3.7 mmol/L (ref 3.5–5.2)
Sodium: 138 mmol/L (ref 134–144)
Total Protein: 7.1 g/dL (ref 6.0–8.5)
eGFR: 82 mL/min/1.73 (ref >59)

## 2023-09-22 LAB — IRON,TIBC AND FERRITIN PANEL
Ferritin: 153 ng/mL — ABNORMAL HIGH (ref 15–150)
Iron Saturation: 16 % (ref 15–55)
Iron: 44 ug/dL (ref 27–159)
Total Iron Binding Capacity: 275 ug/dL (ref 250–450)
UIBC: 231 ug/dL (ref 131–425)

## 2023-09-22 LAB — CBC WITH DIFFERENTIAL/PLATELET
Basophils Absolute: 0 x10E3/uL (ref 0.0–0.2)
Basos: 0 %
EOS (ABSOLUTE): 0.1 x10E3/uL (ref 0.0–0.4)
Eos: 1 %
Hematocrit: 37.6 % (ref 34.0–46.6)
Hemoglobin: 12.3 g/dL (ref 11.1–15.9)
Immature Grans (Abs): 0 x10E3/uL (ref 0.0–0.1)
Immature Granulocytes: 0 %
Lymphocytes Absolute: 3.3 x10E3/uL — ABNORMAL HIGH (ref 0.7–3.1)
Lymphs: 35 %
MCH: 30.2 pg (ref 26.6–33.0)
MCHC: 32.7 g/dL (ref 31.5–35.7)
MCV: 92 fL (ref 79–97)
Monocytes Absolute: 0.7 x10E3/uL (ref 0.1–0.9)
Monocytes: 7 %
Neutrophils Absolute: 5.2 x10E3/uL (ref 1.4–7.0)
Neutrophils: 57 %
Platelets: 268 x10E3/uL (ref 150–450)
RBC: 4.07 x10E6/uL (ref 3.77–5.28)
RDW: 12.4 % (ref 11.7–15.4)
WBC: 9.2 x10E3/uL (ref 3.4–10.8)

## 2023-09-22 LAB — TSH: TSH: 1.74 u[IU]/mL (ref 0.450–4.500)

## 2023-09-22 NOTE — Telephone Encounter (Signed)
 Patient returns call to nurse line.   Advised of message per Dr. Tharon.   Patient reports that she donated blood about one week ago and that may have contributed to her lower levels.   Denies lightheadedness or dizziness.   Patient reports that she will follow moisturizing regimen and start hydroxyzine . She states that she will follow up in one month if no improvements.   Chiquita JAYSON English, RN

## 2023-09-22 NOTE — Progress Notes (Signed)
 Labs overall reassuring against many causes of generalized pruritus. COVID negative.  Iron sat is 16%. Can consider addition of iron supplementation if no improvement with moisturization given sat <20%.

## 2023-09-29 ENCOUNTER — Ambulatory Visit
Admission: RE | Admit: 2023-09-29 | Discharge: 2023-09-29 | Disposition: A | Discharge status: Home / Self Care | Source: Ambulatory Visit | Attending: Family Medicine

## 2023-09-29 DIAGNOSIS — Z1231 Encounter for screening mammogram for malignant neoplasm of breast: Secondary | ICD-10-CM | POA: Diagnosis not present

## 2023-10-21 ENCOUNTER — Other Ambulatory Visit: Payer: Self-pay | Admitting: Family Medicine

## 2023-12-15 NOTE — Progress Notes (Signed)
    SUBJECTIVE:   CHIEF COMPLAINT: A1C  HPI:   [name] is a 63 y.o.  with history notable for prediabetes and elevated BMI presenting for follow up.   Discussed the use of AI scribe software for clinical note transcription with the patient, who gave verbal consent to proceed.  History of Present Illness [name] is a 63 year old female who presents for evaluation of her weight management and blood sugar levels.  Weight management - Persistent difficulty with weight loss despite daily 30-minute walks over the past two months - Current weight approximately 202 pounds; believes actual weight may be under 200 pounds based on clothing - Goal weight is 180 pounds - Diet includes intermittent fasting, black coffee, water, and salads - Occasional consumption of foods such as the [employer], with emphasis on moderation - History of prediabetes with previous A1c in the prediabetic range six months ago - Focused on maintaining health and preventing progression to diabetes  Weight loss Was 198 lbs previously--up 4 pounds despite all of this [facility] Weights   12/18/23 0826  Weight: 202 lb 12.8 oz (92 kg)   The patient has been making the following dietary changes intermittent fasting.  The patient has increasing physical activity through the following: walking  The patient denies a personal history of eating disorder, gastroparesis  or pancreatitis. No family history of medullary thyroid cancer. No symptoms of gallstones.  There is no a chance of pregnancy given patient's history/age/gender.    PERTINENT  PMH / PSH/Family/Social History : obesity, prediabetes   OBJECTIVE:   BP 130/79   Pulse 73   Ht 5' 4 (1.626 m)   Wt 202 lb 12.8 oz (92 kg)   LMP 05/27/2016   SpO2 100%   BMI 34.81 kg/m   Today's weight:  Last Weight  Most recent update: 12/18/2023  8:27 AM    Weight  92 kg (202 lb 12.8 oz)            Review of prior weights: Filed Weights   12/18/23 0826   Weight: 202 lb 12.8 oz (92 kg)     Cardiac: Regular rate and rhythm. Normal S1/S2. No murmurs, rubs, or gallops appreciated. Lungs: Clear bilaterally to ascultation.  Abdomen: Normoactive bowel sounds. No tenderness to deep or light palpation. No rebound or guarding.    Psych: Pleasant and appropriate    ASSESSMENT/PLAN:   Assessment & Plan Elevated fasting blood sugar A1C today  Congratulated on normalizing Discussed ranges  Encounter for immunization Recommended flu, COVID,to protect against infection  BMI 34.0-34.9,adult Discussed options Recommended dietary changes Reviewed medications Rx wegovy   No contraindications She will see cost  Follow up in 1 month   The patient will be moving to physicians due to insurance in January.   Suzann Daring, MD  Family Medicine Teaching Service  [facility]

## 2023-12-18 ENCOUNTER — Encounter: Payer: Self-pay | Admitting: Family Medicine

## 2023-12-18 ENCOUNTER — Ambulatory Visit: Admitting: Family Medicine

## 2023-12-18 VITALS — BP 130/79 | HR 73 | Ht 64.0 in | Wt 202.8 lb

## 2023-12-18 DIAGNOSIS — Z6834 Body mass index (BMI) 34.0-34.9, adult: Secondary | ICD-10-CM | POA: Diagnosis not present

## 2023-12-18 DIAGNOSIS — Z23 Encounter for immunization: Secondary | ICD-10-CM | POA: Diagnosis not present

## 2023-12-18 DIAGNOSIS — R7301 Impaired fasting glucose: Secondary | ICD-10-CM

## 2023-12-18 LAB — POCT GLYCOSYLATED HEMOGLOBIN (HGB A1C): Hemoglobin A1C: 5.6 % (ref 4.0–5.6)

## 2023-12-18 MED ORDER — WEGOVY 0.25 MG/0.5ML ~~LOC~~ SOAJ: 0.2500 mg | SUBCUTANEOUS | 1 refills | Status: AC

## 2023-12-18 NOTE — Assessment & Plan Note (Addendum)
 Discussed options Recommended dietary changes Reviewed medications Rx wegovy   No contraindications She will see cost  Follow up in 1 month

## 2023-12-18 NOTE — Patient Instructions (Addendum)
 It was wonderful to see you today.  Please bring ALL of your medications with you to every visit.   Today we talked about:  Congratulations--Your A1C was 5.6   You are doing great work with your health  I sent in Wegovy  0.25 mg   This is a once a week injection   Side effects include nausea and vomiting  I recommend follow up in 1 month     Please follow up in 1 months   Thank you for choosing [facility].   Please call [phone] with any questions about today's appointment.  Please be sure to schedule follow up at the front  desk before you leave today.   Suzann Daring, MD  Family Medicine

## 2023-12-19 ENCOUNTER — Ambulatory Visit: Payer: Self-pay | Admitting: Family Medicine

## 2023-12-19 LAB — COMPREHENSIVE METABOLIC PANEL WITH GFR
ALT: 14 IU/L (ref 0–32)
AST: 18 IU/L (ref 0–40)
Albumin: 4.2 g/dL (ref 3.9–4.9)
Alkaline Phosphatase: 97 IU/L (ref 49–135)
BUN/Creatinine Ratio: 14 (ref 12–28)
BUN: 12 mg/dL (ref 8–27)
Bilirubin Total: 0.7 mg/dL (ref 0.0–1.2)
CO2: 22 mmol/L (ref 20–29)
Calcium: 9.7 mg/dL (ref 8.7–10.3)
Chloride: 103 mmol/L (ref 96–106)
Creatinine, Ser: 0.87 mg/dL (ref 0.57–1.00)
Globulin, Total: 2.8 g/dL (ref 1.5–4.5)
Glucose: 109 mg/dL — ABNORMAL HIGH (ref 70–99)
Potassium: 4.4 mmol/L (ref 3.5–5.2)
Sodium: 140 mmol/L (ref 134–144)
Total Protein: 7 g/dL (ref 6.0–8.5)
eGFR: 76 mL/min/1.73 (ref >59)

## 2023-12-19 LAB — VITAMIN D 25 HYDROXY (VIT D DEFICIENCY, FRACTURES): Vit D, 25-Hydroxy: 51.1 ng/mL (ref 30.0–100.0)
